# Patient Record
Sex: Female | Born: 1980 | Race: White | Hispanic: No | Marital: Married | Smoking: Never smoker
Health system: Southern US, Community
[De-identification: ages and names within clinical notes are randomized; demographics above are authoritative.]

## PROBLEM LIST (undated history)

## (undated) DIAGNOSIS — R519 Headache, unspecified: Secondary | ICD-10-CM

## (undated) DIAGNOSIS — O1490 Unspecified pre-eclampsia, unspecified trimester: Secondary | ICD-10-CM

## (undated) DIAGNOSIS — I10 Essential (primary) hypertension: Secondary | ICD-10-CM

## (undated) DIAGNOSIS — E559 Vitamin D deficiency, unspecified: Secondary | ICD-10-CM

## (undated) DIAGNOSIS — J45909 Unspecified asthma, uncomplicated: Secondary | ICD-10-CM

## (undated) DIAGNOSIS — E785 Hyperlipidemia, unspecified: Secondary | ICD-10-CM

## (undated) DIAGNOSIS — K219 Gastro-esophageal reflux disease without esophagitis: Secondary | ICD-10-CM

## (undated) DIAGNOSIS — R51 Headache: Secondary | ICD-10-CM

## (undated) DIAGNOSIS — D649 Anemia, unspecified: Secondary | ICD-10-CM

## (undated) DIAGNOSIS — F988 Other specified behavioral and emotional disorders with onset usually occurring in childhood and adolescence: Secondary | ICD-10-CM

## (undated) DIAGNOSIS — Z9289 Personal history of other medical treatment: Secondary | ICD-10-CM

## (undated) DIAGNOSIS — M1990 Unspecified osteoarthritis, unspecified site: Secondary | ICD-10-CM

## (undated) HISTORY — DX: Vitamin D deficiency, unspecified: E55.9

## (undated) HISTORY — DX: Hyperlipidemia, unspecified: E78.5

## (undated) HISTORY — DX: Personal history of other medical treatment: Z92.89

## (undated) HISTORY — DX: Unspecified pre-eclampsia, unspecified trimester: O14.90

---

## 2007-11-06 ENCOUNTER — Observation Stay: Payer: Self-pay | Admitting: Obstetrics and Gynecology

## 2007-11-16 ENCOUNTER — Inpatient Hospital Stay: Payer: Self-pay

## 2007-11-22 ENCOUNTER — Ambulatory Visit: Payer: Self-pay | Admitting: Family Medicine

## 2010-07-27 ENCOUNTER — Ambulatory Visit: Payer: Self-pay | Admitting: Internal Medicine

## 2010-12-09 ENCOUNTER — Ambulatory Visit: Payer: Self-pay

## 2011-04-03 ENCOUNTER — Ambulatory Visit: Payer: Self-pay | Admitting: Internal Medicine

## 2012-04-16 DIAGNOSIS — Z9289 Personal history of other medical treatment: Secondary | ICD-10-CM

## 2012-04-16 HISTORY — DX: Personal history of other medical treatment: Z92.89

## 2013-10-28 DIAGNOSIS — M059 Rheumatoid arthritis with rheumatoid factor, unspecified: Secondary | ICD-10-CM | POA: Insufficient documentation

## 2013-10-28 DIAGNOSIS — M0800 Unspecified juvenile rheumatoid arthritis of unspecified site: Secondary | ICD-10-CM | POA: Insufficient documentation

## 2013-10-28 DIAGNOSIS — G43909 Migraine, unspecified, not intractable, without status migrainosus: Secondary | ICD-10-CM | POA: Insufficient documentation

## 2013-10-28 DIAGNOSIS — Q059 Spina bifida, unspecified: Secondary | ICD-10-CM | POA: Insufficient documentation

## 2015-03-01 ENCOUNTER — Other Ambulatory Visit: Payer: Self-pay

## 2015-03-01 ENCOUNTER — Encounter
Admission: RE | Admit: 2015-03-01 | Discharge: 2015-03-01 | Disposition: A | Discharge status: Home / Self Care | Payer: 59 | Source: Ambulatory Visit | Attending: Obstetrics & Gynecology | Admitting: Obstetrics & Gynecology

## 2015-03-01 DIAGNOSIS — Z01810 Encounter for preprocedural cardiovascular examination: Secondary | ICD-10-CM | POA: Insufficient documentation

## 2015-03-01 DIAGNOSIS — I10 Essential (primary) hypertension: Secondary | ICD-10-CM | POA: Diagnosis not present

## 2015-03-01 DIAGNOSIS — Z01812 Encounter for preprocedural laboratory examination: Secondary | ICD-10-CM | POA: Insufficient documentation

## 2015-03-01 HISTORY — DX: Anemia, unspecified: D64.9

## 2015-03-01 HISTORY — DX: Other specified behavioral and emotional disorders with onset usually occurring in childhood and adolescence: F98.8

## 2015-03-01 HISTORY — DX: Unspecified osteoarthritis, unspecified site: M19.90

## 2015-03-01 HISTORY — DX: Headache, unspecified: R51.9

## 2015-03-01 HISTORY — DX: Headache: R51

## 2015-03-01 HISTORY — DX: Unspecified asthma, uncomplicated: J45.909

## 2015-03-01 HISTORY — DX: Essential (primary) hypertension: I10

## 2015-03-01 LAB — TYPE AND SCREEN
ABO/RH(D): O POS
ANTIBODY SCREEN: POSITIVE
DAT, IGG: NEGATIVE
DAT, complement: NEGATIVE

## 2015-03-01 LAB — CBC
HEMATOCRIT: 38 % (ref 35.0–47.0)
HEMOGLOBIN: 13 g/dL (ref 12.0–16.0)
MCH: 30.2 pg (ref 26.0–34.0)
MCHC: 34.3 g/dL (ref 32.0–36.0)
MCV: 88.1 fL (ref 80.0–100.0)
Platelets: 182 10*3/uL (ref 150–440)
RBC: 4.31 MIL/uL (ref 3.80–5.20)
RDW: 13.1 % (ref 11.5–14.5)
WBC: 7.5 10*3/uL (ref 3.6–11.0)

## 2015-03-01 NOTE — Patient Instructions (Signed)
  Your procedure is scheduled on: March 09, 2015 (Thursday) Report to Day Surgery.[facility], Second Floor) To find out your arrival time please call [phone] between 1PM - 3PM on March 08, 2015 (Wednesday).  Remember: Instructions that are not followed completely may result in serious medical risk, up to and including death, or upon the discretion of your surgeon and anesthesiologist your surgery may need to be rescheduled.    _x___ 1. Do not eat food or drink liquids after midnight. No gum chewing or hard candies.     __x__ 2. No Alcohol for 24 hours before or after surgery.   ____ 3. Bring all medications with you on the day of surgery if instructed.    ___x_ 4. Notify your doctor if there is any change in your medical condition     (cold, fever, infections).     Do not wear jewelry, make-up, hairpins, clips or nail polish.  Do not wear lotions, powders, or perfumes. You may wear deodorant.  Do not shave 48 hours prior to surgery. Men may shave face and neck.  Do not bring valuables to the hospital.    [facility] is not responsible for any belongings or valuables.               Contacts, dentures or bridgework may not be worn into surgery.  Leave your suitcase in the car. After surgery it may be brought to your room.  For patients admitted to the hospital, discharge time is determined by your                treatment team.   Patients discharged the day of surgery will not be allowed to drive home.   Please read over the following fact sheets that you were given:      _x___ Take these medicines the morning of surgery with A SIP OF WATER:    1. Methyldopa  2.   3.   4.  5.  6.  ____ Fleet Enema (as directed)   ____ Use CHG Soap as directed  ____ Use inhalers on the day of surgery  ____ Stop metformin 2 days prior to surgery    ____ Take 1/2 of usual insulin dose the night before surgery and none on the morning of surgery.   ____ Stop  Coumadin/Plavix/aspirin on   _x___ Stop Anti-inflammatories on (Tylenol ok to take for pain if needed)   ____ Stop supplements until after surgery.    ____ Bring C-Pap to the hospital.

## 2015-03-02 LAB — ABO/RH: ABO/RH(D): O POS

## 2015-03-06 NOTE — Pre-Procedure Instructions (Signed)
EKG given to [facility], anesthesia nurse for review.

## 2015-03-09 ENCOUNTER — Ambulatory Visit: Payer: 59 | Admitting: Anesthesiology

## 2015-03-09 ENCOUNTER — Ambulatory Visit
Admission: RE | Admit: 2015-03-09 | Discharge: 2015-03-09 | Disposition: A | Discharge status: Home / Self Care | Payer: 59 | Source: Ambulatory Visit | Attending: Obstetrics & Gynecology | Admitting: Obstetrics & Gynecology

## 2015-03-09 ENCOUNTER — Encounter
Admission: RE | Disposition: A | Discharge status: Home / Self Care | Payer: Self-pay | Source: Ambulatory Visit | Attending: Obstetrics & Gynecology

## 2015-03-09 DIAGNOSIS — Z801 Family history of malignant neoplasm of trachea, bronchus and lung: Secondary | ICD-10-CM | POA: Insufficient documentation

## 2015-03-09 DIAGNOSIS — M069 Rheumatoid arthritis, unspecified: Secondary | ICD-10-CM | POA: Diagnosis not present

## 2015-03-09 DIAGNOSIS — N920 Excessive and frequent menstruation with regular cycle: Secondary | ICD-10-CM | POA: Diagnosis present

## 2015-03-09 DIAGNOSIS — Z880 Allergy status to penicillin: Secondary | ICD-10-CM | POA: Insufficient documentation

## 2015-03-09 DIAGNOSIS — Z808 Family history of malignant neoplasm of other organs or systems: Secondary | ICD-10-CM | POA: Diagnosis not present

## 2015-03-09 DIAGNOSIS — E785 Hyperlipidemia, unspecified: Secondary | ICD-10-CM | POA: Diagnosis not present

## 2015-03-09 DIAGNOSIS — R51 Headache: Secondary | ICD-10-CM | POA: Diagnosis not present

## 2015-03-09 DIAGNOSIS — Z79899 Other long term (current) drug therapy: Secondary | ICD-10-CM | POA: Diagnosis not present

## 2015-03-09 DIAGNOSIS — Z888 Allergy status to other drugs, medicaments and biological substances status: Secondary | ICD-10-CM | POA: Diagnosis not present

## 2015-03-09 DIAGNOSIS — Z836 Family history of other diseases of the respiratory system: Secondary | ICD-10-CM | POA: Diagnosis not present

## 2015-03-09 DIAGNOSIS — F988 Other specified behavioral and emotional disorders with onset usually occurring in childhood and adolescence: Secondary | ICD-10-CM | POA: Diagnosis not present

## 2015-03-09 DIAGNOSIS — I10 Essential (primary) hypertension: Secondary | ICD-10-CM | POA: Diagnosis not present

## 2015-03-09 DIAGNOSIS — Z8249 Family history of ischemic heart disease and other diseases of the circulatory system: Secondary | ICD-10-CM | POA: Diagnosis not present

## 2015-03-09 DIAGNOSIS — E559 Vitamin D deficiency, unspecified: Secondary | ICD-10-CM | POA: Diagnosis not present

## 2015-03-09 DIAGNOSIS — Z807 Family history of other malignant neoplasms of lymphoid, hematopoietic and related tissues: Secondary | ICD-10-CM | POA: Insufficient documentation

## 2015-03-09 DIAGNOSIS — Z882 Allergy status to sulfonamides status: Secondary | ICD-10-CM | POA: Diagnosis not present

## 2015-03-09 HISTORY — PX: DILITATION & CURRETTAGE/HYSTROSCOPY WITH NOVASURE ABLATION: SHX5568

## 2015-03-09 LAB — POCT PREGNANCY, URINE: Preg Test, Ur: NEGATIVE

## 2015-03-09 SURGERY — DILATATION & CURETTAGE/HYSTEROSCOPY WITH NOVASURE ABLATION
Anesthesia: General

## 2015-03-09 MED ORDER — FENTANYL CITRATE (PF) 100 MCG/2ML IJ SOLN
INTRAMUSCULAR | Status: AC
  Filled 2015-03-09: qty 2

## 2015-03-09 MED ORDER — LACTATED RINGERS IV SOLN
INTRAVENOUS | Status: DC
  Administered 2015-03-09 (×2): via INTRAVENOUS

## 2015-03-09 MED ORDER — ONDANSETRON HCL 4 MG/2ML IJ SOLN: 4.0000 mg | Freq: Once | INTRAMUSCULAR | Status: DC | PRN

## 2015-03-09 MED ORDER — FENTANYL CITRATE (PF) 100 MCG/2ML IJ SOLN
25.0000 ug | INTRAMUSCULAR | Status: DC | PRN
  Administered 2015-03-09 (×2): 25 ug via INTRAVENOUS

## 2015-03-09 MED ORDER — MIDAZOLAM HCL 2 MG/2ML IJ SOLN
INTRAMUSCULAR | Status: DC | PRN
  Administered 2015-03-09: 2 mg via INTRAVENOUS

## 2015-03-09 MED ORDER — FENTANYL CITRATE (PF) 100 MCG/2ML IJ SOLN
INTRAMUSCULAR | Status: DC | PRN
  Administered 2015-03-09: 50 ug via INTRAVENOUS

## 2015-03-09 MED ORDER — ONDANSETRON HCL 4 MG/2ML IJ SOLN
INTRAMUSCULAR | Status: DC | PRN
Start: 2015-03-09 — End: 2015-03-09
  Administered 2015-03-09: 4 mg via INTRAVENOUS

## 2015-03-09 MED ORDER — FAMOTIDINE 20 MG PO TABS: 20.0000 mg | ORAL_TABLET | Freq: Once | ORAL | Status: DC

## 2015-03-09 MED ORDER — DEXAMETHASONE SODIUM PHOSPHATE 4 MG/ML IJ SOLN
INTRAMUSCULAR | Status: DC | PRN
  Administered 2015-03-09: 5 mg via INTRAVENOUS

## 2015-03-09 MED ORDER — OXYCODONE-ACETAMINOPHEN 5-325 MG PO TABS: 1.0000 | ORAL_TABLET | ORAL | Status: DC | PRN

## 2015-03-09 MED ORDER — KETOROLAC TROMETHAMINE 30 MG/ML IJ SOLN
INTRAMUSCULAR | Status: DC | PRN
  Administered 2015-03-09: 30 mg via INTRAVENOUS

## 2015-03-09 MED ORDER — SILVER NITRATE-POT NITRATE 75-25 % EX MISC
CUTANEOUS | Status: AC
  Filled 2015-03-09: qty 2

## 2015-03-09 MED ORDER — FAMOTIDINE 20 MG PO TABS
ORAL_TABLET | ORAL | Status: AC
  Administered 2015-03-09: 20 mg
  Filled 2015-03-09: qty 1

## 2015-03-09 SURGICAL SUPPLY — 15 items
CANISTER SUCT 3000ML (MISCELLANEOUS) ×3 IMPLANT
CATH ROBINSON RED A/P 16FR (CATHETERS) ×3 IMPLANT
GLOVE BIO SURGEON STRL SZ8 (GLOVE) ×3 IMPLANT
GOWN STRL REUS W/ TWL LRG LVL3 (GOWN DISPOSABLE) ×1 IMPLANT
GOWN STRL REUS W/ TWL XL LVL3 (GOWN DISPOSABLE) ×1 IMPLANT
GOWN STRL REUS W/TWL LRG LVL3 (GOWN DISPOSABLE) ×2
GOWN STRL REUS W/TWL XL LVL3 (GOWN DISPOSABLE) ×2
IV LACTATED RINGERS 1000ML (IV SOLUTION) ×3 IMPLANT
NS IRRIG 500ML POUR BTL (IV SOLUTION) ×3 IMPLANT
PACK DNC HYST (MISCELLANEOUS) ×3 IMPLANT
PAD OB MATERNITY 4.3X12.25 (PERSONAL CARE ITEMS) ×3 IMPLANT
PAD PREP 24X41 OB/GYN DISP (PERSONAL CARE ITEMS) ×3 IMPLANT
STRAP SAFETY BODY (MISCELLANEOUS) ×3 IMPLANT
TUBING CONNECTING 10 (TUBING) ×2 IMPLANT
TUBING CONNECTING 10' (TUBING) ×1

## 2015-03-09 NOTE — Transfer of Care (Signed)
Immediate Anesthesia Transfer of Care Note  Patient: Angelica Banks  Procedure(s) Performed: Procedure(s): DILATATION & CURETTAGE/HYSTEROSCOPY WITH NOVASURE ABLATION (N/A)  Patient Location: PACU  Anesthesia Type:General  Level of Consciousness: awake, alert  and oriented  Airway & Oxygen Therapy: Patient Spontanous Breathing and Patient connected to face mask oxygen  Post-op Assessment: Report given to RN and Post -op Vital signs reviewed and stable  Post vital signs: Reviewed and stable  Last Vitals:  Filed Vitals:   03/09/15 0908 03/09/15 1013  BP: 144/101 139/93  Pulse: 78   Temp: 37.2 C   Resp: 16     Complications: No apparent anesthesia complications

## 2015-03-09 NOTE — Op Note (Signed)
Operative Note  03/09/2015  PRE-OP DIAGNOSIS: Menorrhagia, Irregular Uterine Bleeding  POST-OP DIAGNOSIS: same   SURGEON: Barnett Applebaum, MD, FACOG  PROCEDURE: Procedure(s): DILATATION & CURETTAGE/HYSTEROSCOPY    ANESTHESIA: Choice   ESTIMATED BLOOD LOSS: Min, <61mL   SPECIMENS:  Endometrial currettings  FLUID DEFICIT: None  COMPLICATIONS: None  DISPOSITION: PACU - hemodynamically stable.  CONDITION: stable  FINDINGS: Exam under anesthesia revealed small, mobile midplane uterus with no masses and bilateral adnexa without masses or fullness. Hysteroscopy revealed a  grossly normal appearing uterine cavity with bilateral tubal ostia and normal appearing endocervical canal. Some proliferative endometrium visualized.  PROCEDURE IN DETAIL: After informed consent was obtained, the patient was taken to the operating room where anesthesia was obtained without difficulty. The patient was positioned in the dorsal lithotomy position in [place]. [name] bladder was catheterized with an in and out foley catheter. The patient was examined under anesthesia, with the above noted findings. The weightedspeculum was placed inside the patient's vagina, and the the anterior lip of the cervix was seen and grasped with the tenaculum. The uterine cavity was sounded to 7cm, and then the cervix was progressively dilated to a 18 French-Pratt dilator. The 30 degree hysteroscope was introduced, with saline fluid used to distend the intrauterine cavity, with the above noted findings.  The hystersocope was removed and the uterine cavity was curetted until a gritty texture was noted, yielding endometrial curettings. Excellent hemostasis was noted, and all instruments were removed, with excellent hemostasis noted throughout. She was then taken out of dorsal lithotomy. Minimal discrepancy in fluid was noted.  The patient tolerated the procedure well. Sponge, lap and needle counts were correct x2. The patient  was taken to recovery room in excellent condition.

## 2015-03-09 NOTE — H&P (Addendum)
History and Physical Interval Note:  03/09/2015 10:19 AM  Angelica Banks  has presented today for surgery, with the diagnosis of IRREGULAR UTERINE BLEEDING. The various methods of treatment have been discussed with the patient and family. After consideration of risks, benefits and other options for treatment, the patient has consented to  Procedure(s): DILATATION & CURETTAGE/HYSTEROSCOPY as a surgical intervention .  [name] history has been reviewed, patient examined, no change in status, stable for surgery.  Pt has the following beta blocker history-  Not taking Beta Blocker.  I have reviewed the patient's chart and labs.  Questions were answered to the patient's satisfaction.    Margalit Leece Eddie Dibbles

## 2015-03-09 NOTE — Anesthesia Procedure Notes (Signed)
Procedure Name: LMA Insertion Date/Time: 03/09/2015 10:50 AM Performed by: [facility] Pre-anesthesia Checklist: Patient identified, Emergency Drugs available, Suction available and Patient being monitored Patient Re-evaluated:Patient Re-evaluated prior to inductionOxygen Delivery Method: Circle system utilized Preoxygenation: Pre-oxygenation with 100% oxygen Intubation Type: IV induction Ventilation: Mask ventilation without difficulty LMA: LMA inserted LMA Size: 3.5 Number of attempts: 1

## 2015-03-09 NOTE — Anesthesia Preprocedure Evaluation (Signed)
Anesthesia Evaluation  Patient identified by MRN, date of birth, ID band Patient awake    Reviewed: Allergy & Precautions, H&P , NPO status , Patient's Chart, lab work & pertinent test results, reviewed documented beta blocker date and time   Airway Mallampati: II  TM Distance: >3 FB Neck ROM: full    Dental  (+) Teeth Intact   Pulmonary neg pulmonary ROS, asthma ,    Pulmonary exam normal        Cardiovascular Exercise Tolerance: Good hypertension, negative cardio ROS Normal cardiovascular exam Rate:Normal     Neuro/Psych  Headaches, PSYCHIATRIC DISORDERS negative neurological ROS  negative psych ROS   GI/Hepatic negative GI ROS, Neg liver ROS,   Endo/Other  negative endocrine ROS  Renal/GU negative Renal ROS  negative genitourinary   Musculoskeletal  (+) Arthritis ,   Abdominal   Peds  Hematology negative hematology ROS (+) anemia ,   Anesthesia Other Findings Good rom cervical  Reproductive/Obstetrics negative OB ROS                             Anesthesia Physical Anesthesia Plan  ASA: II  Anesthesia Plan: General LMA   Post-op Pain Management:    Induction:   Airway Management Planned:   Additional Equipment:   Intra-op Plan:   Post-operative Plan:   Informed Consent: I have reviewed the patients History and Physical, chart, labs and discussed the procedure including the risks, benefits and alternatives for the proposed anesthesia with the patient or authorized representative who has indicated his/her understanding and acceptance.     Plan Discussed with: CRNA  Anesthesia Plan Comments:         Anesthesia Quick Evaluation

## 2015-03-09 NOTE — Discharge Instructions (Signed)
General Gynecological Post-Operative Instructions You may expect to feel dizzy, weak, and drowsy for as long as 24 hours after receiving the medicine that made you sleep (anesthetic).  Do not drive a car, ride a bicycle, participate in physical activities, or take public transportation until you are done taking narcotic pain medicines or as directed by your doctor.  Do not drink alcohol or take tranquilizers.  Do not take medicine that has not been prescribed by your doctor.  Do not sign important papers or make important decisions while on narcotic pain medicines.  Have a responsible person with you.  CARE OF INCISION  Keep incision clean and dry. Take showers instead of baths until your doctor gives you permission to take baths.  Avoid heavy lifting (more than 10 pounds/4.5 kilograms), pushing, or pulling.  Avoid activities that may risk injury to your surgical site.  No sexual intercourse or placement of anything in the vagina for 1-2 weeks or as instructed by your doctor. If you have tubes coming from the wound site, check with your doctor regarding appropriate care of the tubes. Only take prescription or over-the-counter medicines  for pain, discomfort, or fever as directed by your doctor. Do not take aspirin. It can make you bleed. Take medicines (antibiotics) that kill germs if they are prescribed for you.  Call the office or go to the ER if:  You feel sick to your stomach (nauseous) and you start to throw up (vomit).  You have trouble eating or drinking.  You have an oral temperature above 101.  You have constipation that is not helped by adjusting diet or increasing fluid intake. Pain medicines are a common cause of constipation.  You have any other concerns. SEEK IMMEDIATE MEDICAL CARE IF:  You have persistent dizziness.  You have difficulty breathing or a congested sounding (croupy) cough.  You have an oral temperature above 102.5, not controlled by medicine.  There is increasing  pain or tenderness near or in the surgical site.     AMBULATORY SURGERY  DISCHARGE INSTRUCTIONS   1) The drugs that you were given will stay in your system until tomorrow so for the next 24 hours you should not:  A) Drive an automobile B) Make any legal decisions C) Drink any alcoholic beverage   2) You may resume regular meals tomorrow.  Today it is better to start with liquids and gradually work up to solid foods.  You may eat anything you prefer, but it is better to start with liquids, then soup and crackers, and gradually work up to solid foods.   3) Please notify your doctor immediately if you have any unusual bleeding, trouble breathing, redness and pain at the surgery site, drainage, fever, or pain not relieved by medication.    4) Additional Instructions:        Please contact your physician with any problems or Same Day Surgery at [phone], Monday through Friday 6 am to 4 pm, or [place] at [facility] number at [phone].

## 2015-03-10 LAB — SURGICAL PATHOLOGY

## 2015-03-10 NOTE — Anesthesia Postprocedure Evaluation (Signed)
Anesthesia Post Note  Patient: Angelica Banks  Procedure(s) Performed: Procedure(s) (LRB): DILATATION & CURETTAGE/HYSTEROSCOPY WITH NOVASURE ABLATION (N/A)  Patient location during evaluation: PACU Anesthesia Type: General Level of consciousness: awake and alert Pain management: pain level controlled Vital Signs Assessment: post-procedure vital signs reviewed and stable Respiratory status: spontaneous breathing, nonlabored ventilation, respiratory function stable and patient connected to nasal cannula oxygen Cardiovascular status: blood pressure returned to baseline and stable Postop Assessment: no signs of nausea or vomiting Anesthetic complications: no    Last Vitals:  Filed Vitals:   03/09/15 1218 03/09/15 1241  BP: 127/90 143/92  Pulse: 52 60  Temp: 35.9 C   Resp: 16     Last Pain:  Filed Vitals:   03/10/15 0835  PainSc: 0-No pain                 Molli Barrows

## 2015-05-30 LAB — HM PAP SMEAR: HM Pap smear: NEGATIVE

## 2016-03-21 ENCOUNTER — Inpatient Hospital Stay: Admission: RE | Admit: 2016-03-21 | Payer: 59 | Source: Ambulatory Visit

## 2016-03-22 ENCOUNTER — Encounter
Admission: RE | Admit: 2016-03-22 | Discharge: 2016-03-22 | Disposition: A | Discharge status: Home / Self Care | Payer: 59 | Source: Ambulatory Visit | Attending: Obstetrics & Gynecology | Admitting: Obstetrics & Gynecology

## 2016-03-22 HISTORY — DX: Gastro-esophageal reflux disease without esophagitis: K21.9

## 2016-03-22 NOTE — Patient Instructions (Signed)
  Your procedure is scheduled on: 03-27-16 [facility]) Report to Same Day Surgery 2nd floor medical mall [facility] Entrance-take elevator on left to 2nd floor.  Check in with surgery information desk.) To find out your arrival time please call [phone] between 1PM - 3PM on 03-26-16 (TUESDAY)  Remember: Instructions that are not followed completely may result in serious medical risk, up to and including death, or upon the discretion of your surgeon and anesthesiologist your surgery may need to be rescheduled.    _x___ 1. Do not eat food or drink liquids after midnight. No gum chewing or hard candies.     __x__ 2. No Alcohol for 24 hours before or after surgery.   __x__3. No Smoking for 24 prior to surgery.   ____  4. Bring all medications with you on the day of surgery if instructed.    __x__ 5. Notify your doctor if there is any change in your medical condition     (cold, fever, infections).     Do not wear jewelry, make-up, hairpins, clips or nail polish.  Do not wear lotions, powders, or perfumes. You may wear deodorant.  Do not shave 48 hours prior to surgery. Men may shave face and neck.  Do not bring valuables to the hospital.    [facility] is not responsible for any belongings or valuables.               Contacts, dentures or bridgework may not be worn into surgery.  Leave your suitcase in the car. After surgery it may be brought to your room.  For patients admitted to the hospital, discharge time is determined by your treatment team.   Patients discharged the day of surgery will not be allowed to drive home.  You will need someone to drive you home and stay with you the night of your procedure.    Please read over the following fact sheets that you were given:   [facility] Preparing for Surgery and or MRSA Information   _x___ Take these medicines the morning of surgery with A SIP OF WATER:    1. ALDOMET (METHYLDOPA)  2.  3.  4.  5.  6.  ____Fleets enema or  Magnesium Citrate as directed.   ____ Use CHG Soap or sage wipes as directed on instruction sheet   ____ Use inhalers on the day of surgery and bring to hospital day of surgery  ____ Stop metformin 2 days prior to surgery    ____ Take 1/2 of usual insulin dose the night before surgery and none on the morning of surgery.   ____ Stop Aspirin, Coumadin, Pllavix ,Eliquis, Effient, or Pradaxa  x__ Stop Anti-inflammatories such as Advil, Aleve, Ibuprofen, Motrin, Naproxen,          Naprosyn, Goodies powders or aspirin products NOW- Ok to take Tylenol.   ____ Stop supplements until after surgery.    ____ Bring C-Pap to the hospital.

## 2016-03-25 ENCOUNTER — Encounter
Admission: RE | Admit: 2016-03-25 | Discharge: 2016-03-25 | Disposition: A | Discharge status: Home / Self Care | Payer: 59 | Source: Ambulatory Visit | Attending: Obstetrics & Gynecology | Admitting: Obstetrics & Gynecology

## 2016-03-25 DIAGNOSIS — M1990 Unspecified osteoarthritis, unspecified site: Secondary | ICD-10-CM | POA: Diagnosis not present

## 2016-03-25 DIAGNOSIS — N939 Abnormal uterine and vaginal bleeding, unspecified: Secondary | ICD-10-CM | POA: Diagnosis not present

## 2016-03-25 DIAGNOSIS — I10 Essential (primary) hypertension: Secondary | ICD-10-CM | POA: Diagnosis not present

## 2016-03-25 DIAGNOSIS — R51 Headache: Secondary | ICD-10-CM | POA: Diagnosis not present

## 2016-03-25 DIAGNOSIS — D649 Anemia, unspecified: Secondary | ICD-10-CM | POA: Diagnosis not present

## 2016-03-25 DIAGNOSIS — D250 Submucous leiomyoma of uterus: Secondary | ICD-10-CM | POA: Diagnosis not present

## 2016-03-25 DIAGNOSIS — K219 Gastro-esophageal reflux disease without esophagitis: Secondary | ICD-10-CM | POA: Diagnosis not present

## 2016-03-25 DIAGNOSIS — J45909 Unspecified asthma, uncomplicated: Secondary | ICD-10-CM | POA: Diagnosis not present

## 2016-03-25 DIAGNOSIS — N840 Polyp of corpus uteri: Secondary | ICD-10-CM | POA: Diagnosis not present

## 2016-03-25 LAB — CBC
HEMATOCRIT: 35.2 % (ref 35.0–47.0)
HEMOGLOBIN: 11.8 g/dL — AB (ref 12.0–16.0)
MCH: 27.4 pg (ref 26.0–34.0)
MCHC: 33.6 g/dL (ref 32.0–36.0)
MCV: 81.8 fL (ref 80.0–100.0)
Platelets: 195 10*3/uL (ref 150–440)
RBC: 4.3 MIL/uL (ref 3.80–5.20)
RDW: 14.5 % (ref 11.5–14.5)
WBC: 5.8 10*3/uL (ref 3.6–11.0)

## 2016-03-25 LAB — TYPE AND SCREEN
ABO/RH(D): O POS
ANTIBODY SCREEN: NEGATIVE

## 2016-03-27 ENCOUNTER — Encounter: Payer: Self-pay | Admitting: *Deleted

## 2016-03-27 ENCOUNTER — Encounter
Admission: RE | Disposition: A | Discharge status: Home / Self Care | Payer: Self-pay | Source: Ambulatory Visit | Attending: Obstetrics & Gynecology

## 2016-03-27 ENCOUNTER — Ambulatory Visit: Payer: 59 | Admitting: Anesthesiology

## 2016-03-27 ENCOUNTER — Ambulatory Visit
Admission: RE | Admit: 2016-03-27 | Discharge: 2016-03-27 | Disposition: A | Discharge status: Home / Self Care | Payer: 59 | Source: Ambulatory Visit | Attending: Obstetrics & Gynecology | Admitting: Obstetrics & Gynecology

## 2016-03-27 DIAGNOSIS — N840 Polyp of corpus uteri: Secondary | ICD-10-CM | POA: Insufficient documentation

## 2016-03-27 DIAGNOSIS — I10 Essential (primary) hypertension: Secondary | ICD-10-CM | POA: Insufficient documentation

## 2016-03-27 DIAGNOSIS — R51 Headache: Secondary | ICD-10-CM | POA: Insufficient documentation

## 2016-03-27 DIAGNOSIS — D250 Submucous leiomyoma of uterus: Secondary | ICD-10-CM | POA: Diagnosis not present

## 2016-03-27 DIAGNOSIS — J45909 Unspecified asthma, uncomplicated: Secondary | ICD-10-CM | POA: Insufficient documentation

## 2016-03-27 DIAGNOSIS — N939 Abnormal uterine and vaginal bleeding, unspecified: Secondary | ICD-10-CM | POA: Insufficient documentation

## 2016-03-27 DIAGNOSIS — K219 Gastro-esophageal reflux disease without esophagitis: Secondary | ICD-10-CM | POA: Insufficient documentation

## 2016-03-27 DIAGNOSIS — D649 Anemia, unspecified: Secondary | ICD-10-CM | POA: Insufficient documentation

## 2016-03-27 DIAGNOSIS — M1990 Unspecified osteoarthritis, unspecified site: Secondary | ICD-10-CM | POA: Insufficient documentation

## 2016-03-27 HISTORY — PX: DILATATION & CURETTAGE/HYSTEROSCOPY WITH MYOSURE: SHX6511

## 2016-03-27 LAB — POCT PREGNANCY, URINE: PREG TEST UR: NEGATIVE

## 2016-03-27 SURGERY — DILATATION & CURETTAGE/HYSTEROSCOPY WITH MYOSURE
Anesthesia: General | Wound class: Clean Contaminated

## 2016-03-27 MED ORDER — PROMETHAZINE HCL 25 MG/ML IJ SOLN: 6.2500 mg | INTRAMUSCULAR | Status: DC | PRN

## 2016-03-27 MED ORDER — LACTATED RINGERS IV SOLN
INTRAVENOUS | Status: DC
  Administered 2016-03-27: 09:00:00 via INTRAVENOUS

## 2016-03-27 MED ORDER — FAMOTIDINE 20 MG PO TABS
ORAL_TABLET | ORAL | Status: AC
  Administered 2016-03-27: 20 mg via ORAL
  Filled 2016-03-27: qty 1

## 2016-03-27 MED ORDER — FAMOTIDINE 20 MG PO TABS
20.0000 mg | ORAL_TABLET | Freq: Once | ORAL | Status: AC
  Administered 2016-03-27: 20 mg via ORAL

## 2016-03-27 MED ORDER — ONDANSETRON HCL 4 MG/2ML IJ SOLN
INTRAMUSCULAR | Status: AC
  Filled 2016-03-27: qty 2

## 2016-03-27 MED ORDER — SCOPOLAMINE 1 MG/3DAYS TD PT72
MEDICATED_PATCH | TRANSDERMAL | Status: AC
  Administered 2016-03-27: 1.5 mg via TRANSDERMAL
  Filled 2016-03-27: qty 1

## 2016-03-27 MED ORDER — KETAMINE HCL 50 MG/ML IJ SOLN
INTRAMUSCULAR | Status: DC | PRN
  Administered 2016-03-27: 40 mg via INTRAVENOUS

## 2016-03-27 MED ORDER — PROPOFOL 10 MG/ML IV BOLUS
INTRAVENOUS | Status: AC
  Filled 2016-03-27: qty 20

## 2016-03-27 MED ORDER — MIDAZOLAM HCL 2 MG/2ML IJ SOLN
INTRAMUSCULAR | Status: AC
  Filled 2016-03-27: qty 2

## 2016-03-27 MED ORDER — MIDAZOLAM HCL 2 MG/2ML IJ SOLN
INTRAMUSCULAR | Status: DC | PRN
  Administered 2016-03-27: 2 mg via INTRAVENOUS

## 2016-03-27 MED ORDER — OXYCODONE-ACETAMINOPHEN 5-325 MG PO TABS
ORAL_TABLET | ORAL | Status: AC
  Filled 2016-03-27: qty 1

## 2016-03-27 MED ORDER — OXYCODONE-ACETAMINOPHEN 5-325 MG PO TABS
1.0000 | ORAL_TABLET | ORAL | Status: DC | PRN
  Administered 2016-03-27: 1 via ORAL

## 2016-03-27 MED ORDER — LIDOCAINE 2% (20 MG/ML) 5 ML SYRINGE
INTRAMUSCULAR | Status: AC
  Filled 2016-03-27: qty 5

## 2016-03-27 MED ORDER — GLYCOPYRROLATE 0.2 MG/ML IJ SOLN
INTRAMUSCULAR | Status: DC | PRN
  Administered 2016-03-27: 0.2 mg via INTRAVENOUS

## 2016-03-27 MED ORDER — PROPOFOL 10 MG/ML IV BOLUS
INTRAVENOUS | Status: DC | PRN
  Administered 2016-03-27: 150 mg via INTRAVENOUS

## 2016-03-27 MED ORDER — LIDOCAINE HCL (CARDIAC) 20 MG/ML IV SOLN
INTRAVENOUS | Status: DC | PRN
  Administered 2016-03-27: 100 mg via INTRAVENOUS

## 2016-03-27 MED ORDER — FENTANYL CITRATE (PF) 100 MCG/2ML IJ SOLN
INTRAMUSCULAR | Status: DC | PRN
  Administered 2016-03-27: 100 ug via INTRAVENOUS

## 2016-03-27 MED ORDER — KETOROLAC TROMETHAMINE 30 MG/ML IJ SOLN
INTRAMUSCULAR | Status: AC
  Filled 2016-03-27: qty 1

## 2016-03-27 MED ORDER — GLYCOPYRROLATE 0.2 MG/ML IJ SOLN
INTRAMUSCULAR | Status: AC
  Filled 2016-03-27: qty 1

## 2016-03-27 MED ORDER — DEXAMETHASONE SODIUM PHOSPHATE 4 MG/ML IJ SOLN
INTRAMUSCULAR | Status: DC | PRN
  Administered 2016-03-27: 5 mg via INTRAVENOUS

## 2016-03-27 MED ORDER — OXYCODONE-ACETAMINOPHEN 5-325 MG PO TABS: 1.0000 | ORAL_TABLET | ORAL | 0 refills | Status: DC | PRN

## 2016-03-27 MED ORDER — SCOPOLAMINE 1 MG/3DAYS TD PT72
1.0000 | MEDICATED_PATCH | Freq: Once | TRANSDERMAL | Status: DC
  Administered 2016-03-27: 1.5 mg via TRANSDERMAL

## 2016-03-27 MED ORDER — KETAMINE HCL 10 MG/ML IJ SOLN
INTRAMUSCULAR | Status: AC
  Filled 2016-03-27: qty 1

## 2016-03-27 MED ORDER — FENTANYL CITRATE (PF) 100 MCG/2ML IJ SOLN
INTRAMUSCULAR | Status: AC
  Filled 2016-03-27: qty 2

## 2016-03-27 MED ORDER — KETOROLAC TROMETHAMINE 30 MG/ML IJ SOLN
INTRAMUSCULAR | Status: DC | PRN
Start: 2016-03-27 — End: 2016-03-27
  Administered 2016-03-27: 30 mg via INTRAVENOUS

## 2016-03-27 MED ORDER — ONDANSETRON HCL 4 MG/2ML IJ SOLN
INTRAMUSCULAR | Status: DC | PRN
  Administered 2016-03-27: 4 mg via INTRAVENOUS

## 2016-03-27 MED ORDER — FENTANYL CITRATE (PF) 100 MCG/2ML IJ SOLN: 25.0000 ug | INTRAMUSCULAR | Status: DC | PRN

## 2016-03-27 SURGICAL SUPPLY — 48 items
ABLATOR ENDOMETRIAL MYOSURE (ABLATOR) IMPLANT
BAG COUNTER SPONGE EZ (MISCELLANEOUS) ×2 IMPLANT
CANISTER SUC SOCK COL 7IN (MISCELLANEOUS) ×3 IMPLANT
CANISTER SUCT 1200ML W/VALVE (MISCELLANEOUS) ×3 IMPLANT
CATH ROBINSON RED A/P 16FR (CATHETERS) ×3 IMPLANT
CATH TRAY 16F METER LATEX (MISCELLANEOUS) IMPLANT
CHLORAPREP W/TINT 26ML (MISCELLANEOUS) ×3 IMPLANT
CLOSURE WOUND 1/2 X4 (GAUZE/BANDAGES/DRESSINGS)
COUNTER NEEDLE 20/40 LG (NEEDLE) IMPLANT
COUNTER SPONGE BAG EZ (MISCELLANEOUS) ×1
DEVICE MYOSURE LITE (MISCELLANEOUS) ×3 IMPLANT
DRAPE LAPAROTOMY 100X77 ABD (DRAPES) IMPLANT
DRAPE LAPAROTOMY TRNSV 106X77 (MISCELLANEOUS) IMPLANT
DRSG TELFA 3X8 NADH (GAUZE/BANDAGES/DRESSINGS) IMPLANT
ELECT BLADE 6.5 EXT (BLADE) IMPLANT
ELECT CAUTERY BLADE 6.4 (BLADE) ×3 IMPLANT
ELECT REM PT RETURN 9FT ADLT (ELECTROSURGICAL)
ELECTRODE REM PT RTRN 9FT ADLT (ELECTROSURGICAL) IMPLANT
GAUZE SPONGE 4X4 12PLY STRL (GAUZE/BANDAGES/DRESSINGS) IMPLANT
GLOVE BIO SURGEON STRL SZ8 (GLOVE) ×3 IMPLANT
GOWN STRL REUS W/ TWL LRG LVL3 (GOWN DISPOSABLE) ×1 IMPLANT
GOWN STRL REUS W/ TWL XL LVL3 (GOWN DISPOSABLE) ×1 IMPLANT
GOWN STRL REUS W/TWL LRG LVL3 (GOWN DISPOSABLE) ×2
GOWN STRL REUS W/TWL XL LVL3 (GOWN DISPOSABLE) ×2
NS IRRIG 1000ML POUR BTL (IV SOLUTION) IMPLANT
PACK BASIN MAJOR ARMC (MISCELLANEOUS) ×3 IMPLANT
PACK DNC HYST (MISCELLANEOUS) ×3 IMPLANT
PAD OB MATERNITY 4.3X12.25 (PERSONAL CARE ITEMS) ×3 IMPLANT
PAD PREP 24X41 OB/GYN DISP (PERSONAL CARE ITEMS) ×3 IMPLANT
SOL .9 NS 3000ML IRR  AL (IV SOLUTION) ×2
SOL .9 NS 3000ML IRR UROMATIC (IV SOLUTION) ×1 IMPLANT
SPONGE LAP 18X18 5 PK (GAUZE/BANDAGES/DRESSINGS) IMPLANT
STRAP SAFETY BODY (MISCELLANEOUS) ×3 IMPLANT
STRIP CLOSURE SKIN 1/2X4 (GAUZE/BANDAGES/DRESSINGS) IMPLANT
SUT ETHIBOND 0 (SUTURE) IMPLANT
SUT MAXON ABS #0 GS21 30IN (SUTURE) IMPLANT
SUT VIC AB 0 CT1 27 (SUTURE)
SUT VIC AB 0 CT1 27XCR 8 STRN (SUTURE) IMPLANT
SUT VIC AB 0 CT1 36 (SUTURE) IMPLANT
SUT VIC AB 4-0 PS2 18 (SUTURE) IMPLANT
SUT VICRYL PLUS ABS 0 54 (SUTURE) IMPLANT
SYR CONTROL 10ML (SYRINGE) ×3 IMPLANT
TOWEL BLUE STERILE X RAY DET (MISCELLANEOUS) IMPLANT
TOWEL OR 17X26 4PK STRL BLUE (TOWEL DISPOSABLE) ×3 IMPLANT
TRAY PREP VAG/GEN (MISCELLANEOUS) IMPLANT
TUBING CONNECTING 10 (TUBING) ×2 IMPLANT
TUBING CONNECTING 10' (TUBING) ×1
TUBING HYSTEROSCOPY DOLPHIN (MISCELLANEOUS) IMPLANT

## 2016-03-27 NOTE — Discharge Instructions (Signed)
General Gynecological Post-Operative Instructions °You may expect to feel dizzy, weak, and drowsy for as long as 24 hours after receiving the medicine that made you sleep (anesthetic).  °Do not drive a car, ride a bicycle, participate in physical activities, or take public transportation until you are done taking narcotic pain medicines or as directed by your doctor.  °Do not drink alcohol or take tranquilizers.  °Do not take medicine that has not been prescribed by your doctor.  °Do not sign important papers or make important decisions while on narcotic pain medicines.  °Have a responsible person with you.  °CARE OF INCISION  °Keep incision clean and dry. °Take showers instead of baths until your doctor gives you permission to take baths.  °Avoid heavy lifting (more than 10 pounds/4.5 kilograms), pushing, or pulling.  °Avoid activities that may risk injury to your surgical site.  °No sexual intercourse or placement of anything in the vagina for 2 weeks or as instructed by your doctor. °If you have tubes coming from the wound site, check with your doctor regarding appropriate care of the tubes. °Only take prescription or over-the-counter medicines  for pain, discomfort, or fever as directed by your doctor. Do not take aspirin. It can make you bleed. Take medicines (antibiotics) that kill germs if they are prescribed for you.  °Call the office or go to the ER if:  °You feel sick to your stomach (nauseous) and you start to throw up (vomit).  °You have trouble eating or drinking.  °You have an oral temperature above 101.  °You have constipation that is not helped by adjusting diet or increasing fluid intake. Pain medicines are a common cause of constipation.  °You have any other concerns. °SEEK IMMEDIATE MEDICAL CARE IF:  °You have persistent dizziness.  °You have difficulty breathing or a congested sounding (croupy) cough.  °You have an oral temperature above 102.5, not controlled by medicine.  °There is increasing  pain or tenderness near or in the surgical site.  ° °AMBULATORY SURGERY  °DISCHARGE INSTRUCTIONS ° ° °1) The drugs that you were given will stay in your system until tomorrow so for the next 24 hours you should not: ° °A) Drive an automobile °B) Make any legal decisions °C) Drink any alcoholic beverage ° ° °2) You may resume regular meals tomorrow.  Today it is better to start with liquids and gradually work up to solid foods. ° °You may eat anything you prefer, but it is better to start with liquids, then soup and crackers, and gradually work up to solid foods. ° ° °3) Please notify your doctor immediately if you have any unusual bleeding, trouble breathing, redness and pain at the surgery site, drainage, fever, or pain not relieved by medication. ° ° ° °4) Additional Instructions: ° ° ° ° ° ° ° °Please contact your physician with any problems or Same Day Surgery at [phone], Monday through Friday 6 am to 4 pm, or [place] at [place] Main number at [phone]. ° ° °

## 2016-03-27 NOTE — H&P (Signed)
History and Physical Interval Note:  03/27/2016 8:36 AM  Angelica Banks  has presented today for surgery, with the diagnosis of FIBROID UTERUS,MENOMETRORRHAGIA  The various methods of treatment have been discussed with the patient and family. After consideration of risks, benefits and other options for treatment, the patient has consented to  Procedure(s): [place] (N/A) MYOMECTOMY (N/A) as a surgical intervention .  [name] history has been reviewed, patient examined, no change in status, stable for surgery.  Pt has the following beta blocker history-  Not taking Beta Blocker.  I have reviewed the patient's chart and labs.  Questions were answered to the patient's satisfaction.    Hoyt Koch

## 2016-03-27 NOTE — Op Note (Signed)
Operative Note  03/27/2016  PRE-OP DIAGNOSIS: Irregular vaginal Bleeding, Pelvic Pain, Submucosal Leiomyoma  POST-OP DIAGNOSIS: same   SURGEON: Barnett Applebaum, MD, FACOG  PROCEDURE: Procedure(s): DILATATION & CURETTAGE/HYSTEROSCOPY WITH MYOSURE  ANESTHESIA: Choice   ESTIMATED BLOOD LOSS: Min   SPECIMENS:  Endometrial curretings  FLUID DEFICIT: Min  COMPLICATIONS: None  DISPOSITION: PACU - hemodynamically stable.  CONDITION: stable  FINDINGS: Exam under anesthesia revealed small, mobile  uterus with no masses and bilateral adnexa without masses or fullness. Hysteroscopy revealed a  grossly normal appearing uterine cavity with bilateral tubal ostia and normal appearing endocervical canal.  One area of thickening but no obvious polyp or fibroid.  PROCEDURE IN DETAIL: After informed consent was obtained, the patient was taken to the operating room where anesthesia was obtained without difficulty. The patient was positioned in the dorsal lithotomy position in [place]. [name] bladder was catheterized with an in and out foley catheter. The patient was examined under anesthesia, with the above noted findings. The weightedspeculum was placed inside the patient's vagina, and the the anterior lip of the cervix was seen and grasped with the tenaculum.  An Endocervical specimen was obtained with a kevorkian curette. The uterine cavity was sounded to 8cm, and then the cervix was progressively dilated to a 15French-Pratt dilator. The 30 degree hysteroscope was introduced, with saline fluid used to distend the intrauterine cavity, with the above noted findings.  The myosure device was used to excise thickened area of endometrium, yielding endometrial curettings. Excellent hemostasis was noted, and all instruments were removed, with excellent hemostasis noted throughout. She was then taken out of dorsal lithotomy.  Minimal discrepancy in fluid was noted.  The patient tolerated the procedure  well. Sponge, lap and needle counts were correct x2. The patient was taken to recovery room in excellent condition.

## 2016-03-27 NOTE — Transfer of Care (Signed)
Immediate Anesthesia Transfer of Care Note  Patient: Angelica Banks  Procedure(s) Performed: Procedure(s): DILATATION & CURETTAGE/HYSTEROSCOPY WITH MYOSURE, MYOMECTOMY (N/A)  Patient Location: PACU  Anesthesia Type:General  Level of Consciousness: sedated  Airway & Oxygen Therapy: Patient Spontanous Breathing and Patient connected to nasal cannula oxygen  Post-op Assessment: Report given to RN and Post -op Vital signs reviewed and stable  Post vital signs: Reviewed and stable  Last Vitals:  Vitals:   03/27/16 0829 03/27/16 0839  BP:  (!) 132/91  Pulse:  80  Resp: 16   Temp:  36.1 C    Last Pain:  Vitals:   03/27/16 0829  TempSrc: Tympanic         Complications: No apparent anesthesia complications

## 2016-03-27 NOTE — Anesthesia Preprocedure Evaluation (Signed)
Anesthesia Evaluation  Patient identified by MRN, date of birth, ID band Patient awake    Reviewed: Allergy & Precautions, H&P , NPO status , Patient's Chart, lab work & pertinent test results, reviewed documented beta blocker date and time   History of Anesthesia Complications (+) PONV and history of anesthetic complications  Airway Mallampati: III  TM Distance: >3 FB Neck ROM: full    Dental  (+) Missing, Dental Advidsory Given, Teeth Intact   Pulmonary neg shortness of breath, asthma , neg sleep apnea, neg COPD, neg recent URI,    Pulmonary exam normal breath sounds clear to auscultation       Cardiovascular Exercise Tolerance: Good hypertension, (-) angina(-) CAD, (-) Past MI, (-) Cardiac Stents and (-) CABG Normal cardiovascular exam(-) dysrhythmias (-) Valvular Problems/Murmurs Rhythm:regular Rate:Normal     Neuro/Psych negative neurological ROS  negative psych ROS   GI/Hepatic Neg liver ROS, GERD  ,  Endo/Other  negative endocrine ROS  Renal/GU negative Renal ROS  negative genitourinary   Musculoskeletal   Abdominal   Peds  Hematology  (+) Blood dyscrasia, anemia ,   Anesthesia Other Findings Past Medical History: No date: ADD (attention deficit disorder) No date: Anemia     Comment: iron deficiency anemia No date: Arthritis     Comment: Rheumatoid arthritis No date: Asthma     Comment: well-controlled-no inhalers No date: GERD (gastroesophageal reflux disease)     Comment: occ- rolaids No date: Headache No date: Hypertension   Reproductive/Obstetrics negative OB ROS                             Anesthesia Physical Anesthesia Plan  ASA: II  Anesthesia Plan: General   Post-op Pain Management:    Induction:   Airway Management Planned:   Additional Equipment:   Intra-op Plan:   Post-operative Plan:   Informed Consent: I have reviewed the patients History and  Physical, chart, labs and discussed the procedure including the risks, benefits and alternatives for the proposed anesthesia with the patient or authorized representative who has indicated his/her understanding and acceptance.   Dental Advisory Given  Plan Discussed with: Anesthesiologist, CRNA and Surgeon  Anesthesia Plan Comments:         Anesthesia Quick Evaluation

## 2016-03-27 NOTE — Anesthesia Procedure Notes (Signed)
Procedure Name: LMA Insertion Date/Time: 03/27/2016 10:08 AM Performed by: Rosaria Ferries, Moyses Pavey Pre-anesthesia Checklist: Patient identified, Emergency Drugs available, Suction available and Patient being monitored Patient Re-evaluated:Patient Re-evaluated prior to inductionOxygen Delivery Method: Circle system utilized Preoxygenation: Pre-oxygenation with 100% oxygen Intubation Type: IV induction LMA Size: 4.0 Number of attempts: 1 Placement Confirmation: breath sounds checked- equal and bilateral Tube secured with: Tape Dental Injury: Teeth and Oropharynx as per pre-operative assessment

## 2016-03-28 LAB — SURGICAL PATHOLOGY

## 2016-03-29 ENCOUNTER — Encounter: Payer: Self-pay | Admitting: Obstetrics & Gynecology

## 2016-03-29 NOTE — Anesthesia Postprocedure Evaluation (Signed)
Anesthesia Post Note  Patient: Angelica Banks  Procedure(s) Performed: Procedure(s) (LRB): DILATATION & CURETTAGE/HYSTEROSCOPY WITH MYOSURE, MYOMECTOMY (N/A)  Patient location during evaluation: PACU Anesthesia Type: General Level of consciousness: awake and alert Pain management: pain level controlled Vital Signs Assessment: post-procedure vital signs reviewed and stable Respiratory status: spontaneous breathing, nonlabored ventilation, respiratory function stable and patient connected to nasal cannula oxygen Cardiovascular status: blood pressure returned to baseline and stable Postop Assessment: no signs of nausea or vomiting Anesthetic complications: no     Last Vitals:  Vitals:   03/27/16 1135 03/27/16 1145  BP: (!) 124/91 122/84  Pulse: 64 60  Resp: 13 14  Temp: 36.5 C 36.4 C    Last Pain:  Vitals:   03/27/16 1145  TempSrc:   PainSc: 3                  Martha Clan

## 2016-06-12 ENCOUNTER — Other Ambulatory Visit: Payer: Self-pay | Admitting: Obstetrics and Gynecology

## 2016-06-12 ENCOUNTER — Telehealth: Payer: Self-pay | Admitting: Obstetrics and Gynecology

## 2016-06-12 MED ORDER — METHYLDOPA 500 MG PO TABS: 500.0000 mg | ORAL_TABLET | Freq: Two times a day (BID) | ORAL | 1 refills | Status: DC

## 2016-06-12 NOTE — Telephone Encounter (Signed)
Pt is calling today needing an Refill on her prescription Methyldopa. Pt is schedule for her Annual 0/47/53 with Elmo Putt Copland. Cb# [phone]

## 2016-06-13 NOTE — Telephone Encounter (Signed)
Please advise 

## 2016-06-13 NOTE — Telephone Encounter (Signed)
Pt states she will pick medication up today

## 2016-06-13 NOTE — Telephone Encounter (Signed)
I sent in RF yesterday. Is she calling back or you just following up? Thx.

## 2016-07-17 DIAGNOSIS — E785 Hyperlipidemia, unspecified: Secondary | ICD-10-CM

## 2016-07-17 DIAGNOSIS — E559 Vitamin D deficiency, unspecified: Secondary | ICD-10-CM | POA: Insufficient documentation

## 2016-07-18 ENCOUNTER — Ambulatory Visit (INDEPENDENT_AMBULATORY_CARE_PROVIDER_SITE_OTHER): Payer: 59 | Admitting: Obstetrics and Gynecology

## 2016-07-18 ENCOUNTER — Encounter: Payer: Self-pay | Admitting: Obstetrics and Gynecology

## 2016-07-18 VITALS — BP 130/80 | HR 62 | Ht 64.0 in | Wt 178.0 lb

## 2016-07-18 DIAGNOSIS — N921 Excessive and frequent menstruation with irregular cycle: Secondary | ICD-10-CM | POA: Diagnosis not present

## 2016-07-18 DIAGNOSIS — Z01419 Encounter for gynecological examination (general) (routine) without abnormal findings: Secondary | ICD-10-CM | POA: Diagnosis not present

## 2016-07-18 DIAGNOSIS — F988 Other specified behavioral and emotional disorders with onset usually occurring in childhood and adolescence: Secondary | ICD-10-CM | POA: Insufficient documentation

## 2016-07-18 DIAGNOSIS — I10 Essential (primary) hypertension: Secondary | ICD-10-CM | POA: Diagnosis not present

## 2016-07-18 MED ORDER — NAPROXEN 500 MG PO TABS: 500.0000 mg | ORAL_TABLET | Freq: Two times a day (BID) | ORAL | 2 refills | Status: DC

## 2016-07-18 MED ORDER — METHYLDOPA 500 MG PO TABS: 500.0000 mg | ORAL_TABLET | Freq: Two times a day (BID) | ORAL | 12 refills | Status: DC

## 2016-07-18 NOTE — Progress Notes (Signed)
Chief Complaint  Patient presents with  . Gynecologic Exam     HPI:      [name] is a 36 y.o. G1P1001 who LMP was Patient's last menstrual period was 06/22/2016., presents today for her annual examination.  Her menses are regular every 28-30 days, lasting 3 days, but has 7 days of spotting before.  Dysmenorrhea severe, occurring throughout menses. Takes ibup 800 mg Q4 hrs/heating pad. She does not have intermenstrual bleeding. DUB sx from last yr resolved after D&C and polypectomy with Dr. Kenton Kingfisher. No longer trying to conceive so prog only BC an option for cycle control, but pt not interested. Didn't have good cycle control with POPs in the past.   Sex activity: single partner, contraception - none.  Infertility for a few yrs. Last Pap: May 30, 2015  Results were: no abnormalities /neg HPV DNA    There is a FH of breast cancer in her pat aunt, genetic testing not indicated. There is no FH of ovarian cancer. The patient does do self-breast exams.  Tobacco use: The patient denies current or previous tobacco use. Alcohol use: social drinker Exercise: moderately active  She does get adequate calcium and Vitamin D in her diet.  We are managing her HTN. She was on lisinopril in the past but changed to methyldopa when trying to conceive. BPs are well controlled, no side effects. Pt prefers to stay on this medication.    Past Medical History:  Diagnosis Date  . ADD (attention deficit disorder)   . Anemia    iron deficiency anemia  . Arthritis    Rheumatoid arthritis  . Asthma    well-controlled-no inhalers  . GERD (gastroesophageal reflux disease)    occ- rolaids  . Headache   . History of Papanicolaou smear of cervix 04/16/2012   -/-  . Hyperlipemia   . Hypertension   . Pre-eclampsia   . Preeclampsia   . Vitamin D deficiency     Past Surgical History:  Procedure Laterality Date  . DILATATION & CURETTAGE/HYSTEROSCOPY WITH MYOSURE N/A 03/27/2016   Procedure:  [place] WITH MYOSURE, MYOMECTOMY;  Surgeon: Gae Dry, MD;  Location: [facility] ORS;  Service: Gynecology;  Laterality: N/A;  . DILITATION & CURRETTAGE/HYSTROSCOPY WITH NOVASURE ABLATION N/A 03/09/2015   Procedure: DILATATION & CURETTAGE/HYSTEROSCOPY WITH NOVASURE ABLATION;  Surgeon: Gae Dry, MD;  Location: [facility] ORS;  Service: Gynecology;  Laterality: N/A;    Family History  Problem Relation Age of Onset  . Hypertension Mother   . Hypertension Father   . Hyperlipidemia Father   . Lymphoma Father 70    non hodgkins   . Brain cancer Paternal Uncle   . Lung cancer Maternal Grandmother   . Breast cancer Paternal Aunt 43    Social History   Social History  . Marital status: Married    Spouse name: N/A  . Number of children: 1  . Years of education: 63   Occupational History  . business     office assistant   Social History Main Topics  . Smoking status: Never Smoker  . Smokeless tobacco: Never Used  . Alcohol use Yes     Comment: occ  . Drug use: No  . Sexual activity: Yes    Birth control/ protection: None   Other Topics Concern  . Not on file   Social History Narrative  . No narrative on file     Current Outpatient Prescriptions:  .  Cholecalciferol (VITAMIN D3  PO), Take 1 tablet by mouth daily., Disp: , Rfl:  .  ibuprofen (ADVIL,MOTRIN) 200 MG tablet, Take 400-800 mg by mouth every 8 (eight) hours as needed (for pain.)., Disp: , Rfl:  .  methyldopa (ALDOMET) 500 MG tablet, Take 1 tablet (500 mg total) by mouth 2 (two) times daily., Disp: 60 tablet, Rfl: 12 .  Multiple Vitamin (MULTIVITAMIN WITH MINERALS) TABS tablet, Take 1 tablet by mouth daily., Disp: , Rfl:  .  naproxen (NAPROSYN) 500 MG tablet, Take 1 tablet (500 mg total) by mouth 2 (two) times daily with a meal., Disp: 30 tablet, Rfl: 2 .  ORENCIA CLICKJECT 161 MG/ML SOAJ, INJECT ONE pen SUBCUTANEOUSLY every week, Disp: , Rfl: 6 .  sulfaSALAzine (AZULFIDINE) 500 MG tablet,  Take by mouth., Disp: , Rfl:   ROS:  Review of Systems  Constitutional: Negative for fever, malaise/fatigue and weight loss.  HENT: Negative for congestion, ear pain and sinus pain.   Respiratory: Negative for cough, shortness of breath and wheezing.   Cardiovascular: Negative for chest pain, orthopnea and leg swelling.  Gastrointestinal: Negative for constipation, diarrhea, nausea and vomiting.  Genitourinary: Negative for dysuria, frequency, hematuria and urgency.       Breast ROS: negative   Musculoskeletal: Positive for joint pain. Negative for back pain and myalgias.  Skin: Negative for itching and rash.  Neurological: Negative for dizziness, tingling, focal weakness and headaches.  Endo/Heme/Allergies: Negative for environmental allergies. Does not bruise/bleed easily.  Psychiatric/Behavioral: Negative for depression and suicidal ideas. The patient is not nervous/anxious and does not have insomnia.     Objective: BP 130/80   Pulse 62   Ht 5\' 4"  (1.626 m)   Wt 178 lb (80.7 kg)   LMP 06/22/2016   BMI 30.55 kg/m    Physical Exam  Constitutional: She is oriented to person, place, and time. She appears well-developed and well-nourished.  Genitourinary: Vagina normal and uterus normal. No erythema or tenderness in the vagina. No vaginal discharge found. Right adnexum does not display mass and does not display tenderness. Left adnexum does not display mass and does not display tenderness. Cervix does not exhibit motion tenderness or polyp. Uterus is not enlarged or tender.  Neck: Normal range of motion. No thyromegaly present.  Cardiovascular: Normal rate, regular rhythm and normal heart sounds.   No murmur heard. Pulmonary/Chest: Effort normal and breath sounds normal. Right breast exhibits no mass, no nipple discharge, no skin change and no tenderness. Left breast exhibits no mass, no nipple discharge, no skin change and no tenderness.  Abdominal: Soft. There is no tenderness.  There is no guarding.  Musculoskeletal: Normal range of motion.  Neurological: She is alert and oriented to person, place, and time. No cranial nerve deficit.  Psychiatric: She has a normal mood and affect. Her behavior is normal.  Vitals reviewed.   Assessment/Plan: Encounter for annual routine gynecological examination  Essential hypertension - Pt doing well on methyldopa with BP control. No longer trying to conceive, but wants to cont current BP med. Rx RF.   Menometrorrhagia - Long menses but light. Prog only BC options. Pt didnt' have cycle control with camila. Not interested in other [facility] options for cycle control. F/u prn. Rx anaprox             GYN counsel family planning choices, adequate intake of calcium and vitamin D     F/U  Return in about 1 year (around 07/18/2017).  Orlandria Kissner B. Hollan Philipp, PA-C 07/18/2016 12:05 PM

## 2016-09-10 ENCOUNTER — Telehealth: Payer: Self-pay

## 2016-09-10 ENCOUNTER — Other Ambulatory Visit: Payer: Self-pay | Admitting: Obstetrics and Gynecology

## 2016-09-10 MED ORDER — MEFENAMIC ACID 250 MG PO CAPS: ORAL_CAPSULE | ORAL | 2 refills | Status: DC

## 2016-09-10 NOTE — Telephone Encounter (Signed)
Pt calling for something stronger for cramps.  Naproxen 400mg  doesn't last more than 4hrs.  She is starting to cramp again.  Can she get something else?  [phone]

## 2016-09-10 NOTE — Progress Notes (Signed)
Pt with dysmen. Naproxen not helping. Will try ponstel.

## 2016-09-10 NOTE — Telephone Encounter (Signed)
Rx ponstel eRxd. RN to notify pt to try that to see if helpful.

## 2016-09-11 NOTE — Telephone Encounter (Signed)
Detailed msg left.

## 2016-12-23 ENCOUNTER — Ambulatory Visit: Payer: 59 | Admitting: Obstetrics and Gynecology

## 2016-12-24 ENCOUNTER — Ambulatory Visit (INDEPENDENT_AMBULATORY_CARE_PROVIDER_SITE_OTHER): Payer: 59 | Admitting: Obstetrics and Gynecology

## 2016-12-24 ENCOUNTER — Encounter: Payer: Self-pay | Admitting: Obstetrics and Gynecology

## 2016-12-24 VITALS — BP 118/74 | Ht 64.0 in | Wt 180.0 lb

## 2016-12-24 DIAGNOSIS — N920 Excessive and frequent menstruation with regular cycle: Secondary | ICD-10-CM

## 2016-12-24 DIAGNOSIS — N946 Dysmenorrhea, unspecified: Secondary | ICD-10-CM | POA: Diagnosis not present

## 2016-12-24 MED ORDER — NORETHINDRONE ACETATE 5 MG PO TABS: 5.0000 mg | ORAL_TABLET | Freq: Every day | ORAL | 2 refills | Status: DC

## 2016-12-24 NOTE — Progress Notes (Signed)
Obstetrics & Gynecology Office Visit   Chief Complaint  Patient presents with  . Advice Only  . Pelvic Pain  abnormal uterine bleeding  History of Present Illness: 36 y.o. G42P1001 female who presents for Abnormal uterine bleeding and pelvic pain. She has had the symptoms for the past 3 years. Pain is her #1 issue. The pain is sometimes gradual in onset. She states that it feels like a pulling down sensation that is intense.  Her pain is located in her bilateral lower quadrants and mid lower abdomen. It does not radiate. The pain is described as severe. The pain usually has its onset approximately 1 week before her menses, during the week of menses, and the week following. Occasionally the pain causes her to miss work in family activities. She has tried multiple treatments, including; ibuprofen, naproxen, and Ponstel. She gets the most relief from ibuprofen, however she has to take up to 4 pills every 4-5 hours to get maximal relief. She has also tried a menstrual cramp heating pad with some relief. Nothing seems to make the pain worse. She also notes that she has nearly constant spotting. She has regular cycles that last for several days. However there are only 3 or 4 days throughout the entire cycle where she has no bleeding at all. Prior to 3 years ago she had irregular menses with cramping, but nothing on the order which she is currently experiencing. She has undergone a D&C and hysteroscopy in December of both 2016 and 2017.  In 2016 the pathology returned as suggestive of a polyp without evidence of neoplasia. In 2017 the final pathology was similar. Her last pelvic ultrasound was in December 2017.  It showed a small fibroid without other obvious pathology.  Her last Pap smear was in February 2017 which was normal with negative HPV. In the past she has been trying for another pregnancy without success. Because of this, she has avoided hormonal medication. She states that she no longer desires  pregnancy and has given up and is not against treatments which will prevent pregnancy. She also denies a history of STDs. Her past surgical history states that she has had an ablation, however this is not true. Review of the operative reports indicates no ablation occurred.   Past Medical History:  Diagnosis Date  . ADD (attention deficit disorder)   . Anemia    iron deficiency anemia  . Arthritis    Rheumatoid arthritis  . Asthma    well-controlled-no inhalers  . GERD (gastroesophageal reflux disease)    occ- rolaids  . Headache   . History of Papanicolaou smear of cervix 04/16/2012   -/-  . Hyperlipemia   . Hypertension   . Pre-eclampsia   . Preeclampsia   . Vitamin D deficiency     Past Surgical History:  Procedure Laterality Date  . DILATATION & CURETTAGE/HYSTEROSCOPY WITH MYOSURE N/A 03/27/2016   Procedure: [place] WITH MYOSURE, MYOMECTOMY;  Surgeon: Gae Dry, MD;  Location: [facility] ORS;  Service: Gynecology;  Laterality: N/A;  . DILITATION & CURRETTAGE/HYSTROSCOPY WITH NOVASURE ABLATION N/A 03/09/2015   Procedure: DILATATION & CURETTAGE/HYSTEROSCOPY WITH NOVASURE ABLATION;  Surgeon: Gae Dry, MD;  Location: [facility] ORS;  Service: Gynecology;  Laterality: N/A;    Gynecologic History: Patient's last menstrual period was 12/13/2016.  Obstetric History: G1P1001  Family History  Problem Relation Age of Onset  . Hypertension Mother   . Hypertension Father   . Hyperlipidemia Father   . Lymphoma Father 55  non hodgkins   . Brain cancer Paternal Uncle   . Lung cancer Maternal Grandmother   . Breast cancer Paternal Aunt 5    Social History   Social History  . Marital status: Married    Spouse name: N/A  . Number of children: 1  . Years of education: 45   Occupational History  . business     office assistant   Social History Main Topics  . Smoking status: Never Smoker  . Smokeless tobacco: Never Used  . Alcohol use Yes      Comment: occ  . Drug use: No  . Sexual activity: Yes    Birth control/ protection: None   Other Topics Concern  . Not on file   Social History Narrative  . No narrative on file    Allergies  Allergen Reactions  . Ceclor [Cefaclor] Rash and Other (See Comments)    "unknown"  . Penicillins Rash    Has patient had a PCN reaction causing immediate rash, facial/tongue/throat swelling, SOB or lightheadedness with hypotension:unsure Has patient had a PCN reaction causing severe rash involving mucus membranes or skin necrosis:unsure Has patient had a PCN reaction that required hospitalization:No Has patient had a PCN reaction occurring within the last 10 years:No If all of the above answers are "NO", then may proceed with Cephalosporin use. Childhood reaction   . Prednisone Palpitations  . Sulfa Antibiotics Rash    Prior to Admission medications   Medication Sig Start Date End Date Taking? Authorizing Provider  Cholecalciferol (VITAMIN D3 PO) Take 1 tablet by mouth daily.   Yes [provider]  methyldopa (ALDOMET) 500 MG tablet Take 1 tablet (500 mg total) by mouth 2 (two) times daily. 3/76/28  Yes Copland, Deirdre Evener, PA-C  Multiple Vitamin (MULTIVITAMIN WITH MINERALS) TABS tablet Take 1 tablet by mouth daily.   Yes [provider]  Maureen Chatters CLICKJECT 315 MG/ML SOAJ INJECT ONE pen SUBCUTANEOUSLY every week 06/10/16  Yes [provider]  ibuprofen (ADVIL,MOTRIN) 200 MG tablet Take 400-800 mg by mouth every 8 (eight) hours as needed (for pain.).    [provider]  Mefenamic Acid (PONSTEL) 250 MG CAPS 2 capsules (500 mg) po once and the onset of menses, then 1 capsule (250 mg) every 6 hours prn cramping max 3 days Patient not taking: Reported on 12/24/2016 04/15/59   Copland, Elmo Putt B, PA-C  naproxen (NAPROSYN) 500 MG tablet Take 1 tablet (500 mg total) by mouth 2 (two) times daily with a meal. Patient not taking: Reported on 12/24/2016 09/13/35   Copland,  Alicia B, PA-C  sulfaSALAzine (AZULFIDINE) 500 MG tablet Take by mouth. 08/24/15   [provider]    Review of Systems  Constitutional: Negative.   HENT: Negative.   Eyes: Negative.   Respiratory: Negative.   Cardiovascular: Negative.   Gastrointestinal: Positive for abdominal pain and heartburn. Negative for blood in stool, constipation, diarrhea, melena, nausea and vomiting.  Genitourinary: Negative.  Negative for dysuria and hematuria.       Otherwise per HPI.   Musculoskeletal: Negative.   Skin: Negative.   Neurological: Negative.   Psychiatric/Behavioral: Negative.      Physical Exam BP 118/74   Ht 5\' 4"  (1.626 m)   Wt 180 lb (81.6 kg)   LMP 12/13/2016   BMI 30.90 kg/m  Patient's last menstrual period was 12/13/2016. Physical Exam  Constitutional: She is oriented to person, place, and time. She appears well-developed and well-nourished. No distress.  HENT:  Head:  Normocephalic and atraumatic.  Eyes: EOM are normal. No scleral icterus.  Neck: Normal range of motion. Neck supple.  Cardiovascular: Normal rate and regular rhythm.   Pulmonary/Chest: Effort normal and breath sounds normal. No respiratory distress. She has no wheezes. She has no rales.  Abdominal: Soft. Bowel sounds are normal. She exhibits no distension and no mass. There is no tenderness. There is no rebound and no guarding.  Musculoskeletal: Normal range of motion. She exhibits no edema.  Neurological: She is alert and oriented to person, place, and time. No cranial nerve deficit.  Skin: Skin is warm and dry. No erythema.  Psychiatric: She has a normal mood and affect. Her behavior is normal. Judgment normal.   Assessment: 36 y.o. G45P1001 female with menorrhagia with regular cycle, but nearly constant spotting.  She also has pelvic pain associated with but not limited to her menses.    Plan: Problem List Items Addressed This Visit    Menorrhagia - Primary   Relevant Medications   norethindrone  (AYGESTIN) 5 MG tablet   Dysmenorrhea   Relevant Medications   norethindrone (AYGESTIN) 5 MG tablet     We discussed multiple etiologies of her irregular bleeding and her pain. Given she has had 2 hysteroscopy with dilation and curettage, both revealing possible fragments of a polyp, it is possible she is reformed polyp. However this would make nearly 3 years in a row where a similar problem has occurred. Given that her pain is her #1 issue, we discussed pain etiologies, including; gastrointestinal, urologic, musculoskeletal, psychiatric, and gynecologic. Given the overall picture, her pain could be most consistent with potentially endometriosis and/or adenomyosis. After thorough discussion regarding diagnosis and treatment options, mutual decision is made to treat presumptively for endometriosis with norethindrone 5 mg by mouth daily. If she fails this therapy, we will need to obtain more up-to-date pelvic ultrasound and possibly endometrial biopsy. Consideration for definitive surgical management may need to be discussed if she does not respond to any of the above measures. She may also need further workup from other subspecialist to investigate other etiologies of pain.  20 minutes spent in face to face discussion with > 50% spent in counseling and management of her dysmenorrhea and menorrhagia with irregular cycles.  Return in about 2 months (around 02/23/2017) for follow up medication.  Prentice Docker, MD 12/24/2016 3:55 PM

## 2017-01-17 ENCOUNTER — Telehealth: Payer: Self-pay

## 2017-01-17 NOTE — Telephone Encounter (Signed)
Pt is on progesterone med, has been on it for two weeks now.  It's making her endometriosis sxs worse and affecting her cycle which she was told it would.  She has had to leave work twice this week b/c of the pain.  Needs to know how long this will last?  Does she need to tough it out?  Is there something else she can try?  If she does need to tough it out, can she have something for pain?  Uses [facility] in [place].  [phone]

## 2017-01-17 NOTE — Telephone Encounter (Signed)
She can try to continue the medication, or if it is making things worse, she can discontinue the medication for now until we have another plan in place. I would like to see her for a pelvic ultrasound as soon as we can get her back in.  From there we can discuss next steps.  Can you help get her in ASAP for a pelvic u/s with me? If so, let me know when and I'll put in the order for the ultrasound.

## 2017-01-17 NOTE — Telephone Encounter (Signed)
Pt aware and tx'd to SP for scheduling. 

## 2017-01-23 ENCOUNTER — Other Ambulatory Visit: Payer: Self-pay | Admitting: Obstetrics and Gynecology

## 2017-01-23 DIAGNOSIS — N920 Excessive and frequent menstruation with regular cycle: Secondary | ICD-10-CM

## 2017-01-27 ENCOUNTER — Ambulatory Visit (INDEPENDENT_AMBULATORY_CARE_PROVIDER_SITE_OTHER): Payer: 59 | Admitting: Obstetrics and Gynecology

## 2017-01-27 ENCOUNTER — Ambulatory Visit (INDEPENDENT_AMBULATORY_CARE_PROVIDER_SITE_OTHER): Payer: 59

## 2017-01-27 VITALS — BP 114/76 | Ht 64.0 in | Wt 176.0 lb

## 2017-01-27 DIAGNOSIS — N7011 Chronic salpingitis: Secondary | ICD-10-CM | POA: Diagnosis not present

## 2017-01-27 DIAGNOSIS — N809 Endometriosis, unspecified: Secondary | ICD-10-CM | POA: Diagnosis not present

## 2017-01-27 DIAGNOSIS — G8929 Other chronic pain: Secondary | ICD-10-CM

## 2017-01-27 DIAGNOSIS — N920 Excessive and frequent menstruation with regular cycle: Secondary | ICD-10-CM

## 2017-01-27 DIAGNOSIS — R102 Pelvic and perineal pain: Principal | ICD-10-CM

## 2017-01-27 DIAGNOSIS — N80129 Deep endometriosis of ovary, unspecified ovary: Secondary | ICD-10-CM | POA: Insufficient documentation

## 2017-01-27 NOTE — Progress Notes (Addendum)
Gynecology Ultrasound Follow Up   Chief Complaint  Patient presents with  . Follow-up  Pelvic pain, abnormal bleeding.  History of Present Illness: Patient is a 36 y.o. female who presents today for ultrasound evaluation of the above .  Ultrasound demonstrates the following findings Adnexa: bilateral hydrosalpinges, bilateral endometrioma (likely)  Uterus: anteverted with endometrial stripe  6.7 mm Additional: no other findings Was started on norethindrone after last visit. Pain and bleeding worse.  Now has stopped and feels somewhat better.   Past Medical History:  Diagnosis Date  . ADD (attention deficit disorder)   . Anemia    iron deficiency anemia  . Arthritis    Rheumatoid arthritis  . Asthma    well-controlled-no inhalers  . GERD (gastroesophageal reflux disease)    occ- rolaids  . Headache   . History of Papanicolaou smear of cervix 04/16/2012   -/-  . Hyperlipemia   . Hypertension   . Pre-eclampsia   . Preeclampsia   . Vitamin D deficiency     Past Surgical History:  Procedure Laterality Date  . DILATATION & CURETTAGE/HYSTEROSCOPY WITH MYOSURE, MYOMECTOMY N/A 03/27/2016   Performed by Gae Dry, MD at [facility] ORS  . DILATATION & CURETTAGE/HYSTEROSCOPY WITH NOVASURE ABLATION N/A 03/09/2015   Performed by Gae Dry, MD at [facility] ORS    Family History  Problem Relation Age of Onset  . Hypertension Mother   . Hypertension Father   . Hyperlipidemia Father   . Lymphoma Father 33       non hodgkins   . Brain cancer Paternal Uncle   . Lung cancer Maternal Grandmother   . Breast cancer Paternal Aunt 34    Social History   Socioeconomic History  . Marital status: Married    Spouse name: Not on file  . Number of children: 1  . Years of education: 29  . Highest education level: Not on file  Social Needs  . Financial resource strain: Not on file  . Food insecurity - worry: Not on file  . Food insecurity - inability: Not on file  .  Transportation needs - medical: Not on file  . Transportation needs - non-medical: Not on file  Occupational History  . Occupation: business    Comment: Surveyor, minerals  Tobacco Use  . Smoking status: Never Smoker  . Smokeless tobacco: Never Used  Substance and Sexual Activity  . Alcohol use: Yes    Comment: occ  . Drug use: No  . Sexual activity: Yes    Birth control/protection: None  Other Topics Concern  . Not on file  Social History Narrative  . Not on file    Allergies  Allergen Reactions  . Ceclor [Cefaclor] Rash and Other (See Comments)    "unknown"  . Penicillins Rash and Other (See Comments)    Has patient had a PCN reaction causing immediate rash, facial/tongue/throat swelling, SOB or lightheadedness with hypotension:unsure Has patient had a PCN reaction causing severe rash involving mucus membranes or skin necrosis:unsure Has patient had a PCN reaction that required hospitalization:No Has patient had a PCN reaction occurring within the last 10 years:No If all of the above answers are "NO", then may proceed with Cephalosporin use. Childhood reaction   . Prednisone Palpitations  . Sulfa Antibiotics Rash    Prior to Admission medications   Medication Sig Start Date End Date Taking? Authorizing Provider  Cholecalciferol (VITAMIN D3 PO) Take 1 tablet by mouth daily.    [provider]  methyldopa (ALDOMET) 500 MG tablet Take 1 tablet (500 mg total) by mouth 2 (two) times daily. 4/82/70   Copland, Deirdre Evener, PA-C  Multiple Vitamin (MULTIVITAMIN WITH MINERALS) TABS tablet Take 1 tablet by mouth daily.    [provider]  Maureen Chatters CLICKJECT 786 MG/ML SOAJ INJECT ONE pen SUBCUTANEOUSLY every week 06/10/16   [provider]  sulfaSALAzine (AZULFIDINE) 500 MG tablet Take by mouth. 08/24/15   [provider]    Physical Exam BP 114/76   Ht 5\' 4"  (1.626 m)   Wt 176 lb (79.8 kg)   BMI 30.21 kg/m    General: NAD HEENT: normocephalic,  anicteric Pulmonary: No increased work of breathing Extremities: no edema, erythema, or tenderness Neurologic: Grossly intact, normal gait Psychiatric: mood appropriate, affect full   Assessment: 36 y.o. G1P1001 with  1. Chronic pelvic pain in female   2. Endometrioma   3. Hydrosalpinx      Plan: Problem List Items Addressed This Visit    Endometrioma   Hydrosalpinx    Other Visit Diagnoses    Chronic pelvic pain in female    -  Primary     Given these significant findings on ultrasound, I recommended that the patient undergo surgery to correct the issue.  The patient states that she still no longer desires to become pregnant.  However, the findings on ultrasound could explain her difficulties with becoming pregnant.  The other benefit of the surgery, in addition to alleviating her pain, would be that she may have increased chance of success and becoming pregnant.  The recommended surgery is bilateral salpingectomy to remove the hydrosalpinges.  Also bilateral ovarian cystectomy would be recommended as surgery.  In the setting of pain and abnormal bleeding, the surgery may provide her significant relief.  It appears she has endometriosis and removal of endometriosis implants may also improve her pain.  If she does desire fertility after the surgery, she will have the option of in vitro fertilization perhaps and the surgery may improve her chances of success with that modality of Conception.  20 minutes spent in face to face discussion with > 50% spent in counseling,management, and coordination of care of her chronic pelvic pain now with a diagnosis of bilateral hydrosalpinges and bilateral endometriomas.  As a result of this visit, we will schedule surgery as noted above.  All patient questions answered and she does wish to proceed.  Prentice Docker, MD 02/24/2017 3:56 PM

## 2017-01-27 NOTE — Patient Instructions (Signed)
Endometriosis Endometriosis is a condition in which the tissue that lines the uterus (endometrium) grows outside of its normal location. The tissue may grow in many locations close to the uterus, but it commonly grows on the ovaries, fallopian tubes, vagina, or bowel. When the uterus sheds the endometrium every menstrual cycle, there is bleeding wherever the endometrial tissue is located. This can cause pain because blood is irritating to tissues that are not normally exposed to it. What are the causes? The cause of endometriosis is not known. What increases the risk? You may be more likely to develop endometriosis if you:  Have a family history of endometriosis.  Have never given birth.  Started your period at age 10 or younger.  Have high levels of estrogen in your body.  Were exposed to a certain medicine (diethylstilbestrol) before you were born (in utero).  Had low birth weight.  Were born as a twin, triplet, or other multiple.  Have a BMI of less than 25. BMI is an estimate of body fat and is calculated from height and weight.  What are the signs or symptoms? Often, there are no symptoms of this condition. If you do have symptoms, they may:  Vary depending on where your endometrial tissue is growing.  Occur during your menstrual period (most common) or midcycle.  Come and go, or you may go months with no symptoms at all.  Stop with menopause.  Symptoms may include:  Pain in the back or abdomen.  Heavier bleeding during periods.  Pain during sex.  Painful bowel movements.  Infertility.  Pelvic pain.  Bleeding more than once a month.  How is this diagnosed? This condition is diagnosed based on your symptoms and a physical exam. You may have tests, such as:  Blood tests and urine tests. These may be done to help rule out other possible causes of your symptoms.  Ultrasound, to look for abnormal tissues.  An X-ray of the lower bowel (barium enema).  An  ultrasound that is done through the vagina (transvaginally).  CT scan.  MRI.  Laparoscopy. In this procedure, a lighted, pencil-sized instrument called a laparoscope is inserted into your abdomen through an incision. The laparoscope allows your health care provider to look at the organs inside your body and check for abnormal tissue to confirm the diagnosis. If abnormal tissue is found, your health care provider may remove a small piece of tissue (biopsy) to be examined under a microscope.  How is this treated? Treatment for this condition may include:  Medicines to relieve pain, such as NSAIDs.  Hormone therapy. This involves using artificial (synthetic) hormones to reduce endometrial tissue growth. Your health care provider may recommend using a hormonal form of birth control, or other medicines.  Surgery. This may be done to remove abnormal endometrial tissue. ? In some cases, tissue may be removed using a laparoscope and a laser (laparoscopic laser treatment). ? In severe cases, surgery may be done to remove the fallopian tubes, uterus, and ovaries (hysterectomy).  Follow these instructions at home:  Take over-the-counter and prescription medicines only as told by your health care provider.  Do not drive or use heavy machinery while taking prescription pain medicine.  Try to avoid activities that cause pain, including sexual activity.  Keep all follow-up visits as told by your health care provider. This is important. Contact a health care provider if:  You have pain in the area between your hip bones (pelvic area) that occurs: ? Before, during, or   after your period. ? In between your period and gets worse during your period. ? During or after sex. ? With bowel movements or urination, especially during your period.  You have problems getting pregnant.  You have a fever. Get help right away if:  You have severe pain that does not get better with medicine.  You have severe  nausea and vomiting, or you cannot eat without vomiting.  You have pain that affects only the lower, right side of your abdomen.  You have abdominal pain that gets worse.  You have abdominal swelling.  You have blood in your stool. This information is not intended to replace advice given to you by your health care provider. Make sure you discuss any questions you have with your health care provider. Document Released: 03/22/2000 Document Revised: 12/29/2015 Document Reviewed: 08/26/2015 Elsevier Interactive Patient Education  2018 Elsevier Inc.  

## 2017-01-28 ENCOUNTER — Encounter: Payer: Self-pay | Admitting: Obstetrics and Gynecology

## 2017-01-28 ENCOUNTER — Telehealth: Payer: Self-pay | Admitting: Obstetrics and Gynecology

## 2017-01-28 NOTE — Telephone Encounter (Signed)
Patient is aware of H&P on 02/24/17 @ 3:30pm (appointment was previously a medication f/u but per patient that appt has already been rescheduled and completed), Pre-admit Testing phone interview to be scheduled, and OR on 03/06/17. Ext given.

## 2017-01-28 NOTE — Telephone Encounter (Signed)
-----   Message from Will Bonnet, MD sent at 01/28/2017 10:43 AM EDT ----- Regarding: Schedule surgery Surgery Booking Request Patient Full Name:  Angelica Banks  MRN: [id number]  DOB: 26-Jul-1980  Surgeon: Prentice Docker, MD  Requested Surgery Date and Time: per patient Primary Diagnosis AND Code: endometrioma (bilateral), bilateral hydrosalpinges, chronic pelvic pain Secondary Diagnosis and Code:  Surgical Procedure: operative laparoscopy, bilateral ovarian cystectomy, bilateral salpingectomy, removal of any abnormal tissue noted. L&D Notification: No Admission Status: same day surgery Length of Surgery: 65 minutes Special Case Needs: none H&P: tbd (date) Phone Interview???: Possibly Interpreter: Language:  Medical Clearance: none Special Scheduling Instructions: none

## 2017-02-24 ENCOUNTER — Encounter: Payer: Self-pay | Admitting: Obstetrics and Gynecology

## 2017-02-24 ENCOUNTER — Ambulatory Visit (INDEPENDENT_AMBULATORY_CARE_PROVIDER_SITE_OTHER): Payer: 59 | Admitting: Obstetrics and Gynecology

## 2017-02-24 VITALS — BP 118/74 | Ht 64.0 in | Wt 176.0 lb

## 2017-02-24 DIAGNOSIS — N920 Excessive and frequent menstruation with regular cycle: Secondary | ICD-10-CM

## 2017-02-24 DIAGNOSIS — N80129 Deep endometriosis of ovary, unspecified ovary: Secondary | ICD-10-CM

## 2017-02-24 DIAGNOSIS — N946 Dysmenorrhea, unspecified: Secondary | ICD-10-CM

## 2017-02-24 DIAGNOSIS — N7011 Chronic salpingitis: Secondary | ICD-10-CM

## 2017-02-24 DIAGNOSIS — N809 Endometriosis, unspecified: Secondary | ICD-10-CM

## 2017-02-24 NOTE — Progress Notes (Signed)
Preoperative History and Physical  Angelica Banks is a 36 y.o. G1P1001 here for surgical management of chronic pelvic pain, bilateral endometriomas, bilateral hydrosalpinges.   No significant preoperative concerns.  History of Present Illness: 36 y.o. G60P1001 female who presents for Abnormal uterine bleeding and pelvic pain. She has had the symptoms for the past 3 years. Pain is her #1 issue. The pain is sometimes gradual in onset. She states that it feels like a pulling down sensation that is intense.  Her pain is located in her bilateral lower quadrants and mid lower abdomen. It does not radiate. The pain is described as severe. The pain usually has its onset approximately 1 week before her menses, during the week of menses, and the week following. Occasionally the pain causes her to miss work in family activities. She has tried multiple treatments, including; ibuprofen, naproxen, and Ponstel. She gets the most relief from ibuprofen, however she has to take up to 4 pills every 4-5 hours to get maximal relief. She has also tried a menstrual cramp heating pad with some relief. Nothing seems to make the pain worse. She also notes that she has nearly constant spotting. She has regular cycles that last for several days. However there are only 3 or 4 days throughout the entire cycle where she has no bleeding at all. Prior to 3 years ago she had irregular menses with cramping, but nothing on the order which she is currently experiencing. She has undergone a D&C and hysteroscopy in December of both 2016 and 2017.  In 2016 the pathology returned as suggestive of a polyp without evidence of neoplasia. In 2017 the final pathology was similar. Her last pelvic ultrasound was in December 2017.  It showed a small fibroid without other obvious pathology.  Her last Pap smear was in February 2017 which was normal with negative HPV. In the past she has been trying for another pregnancy without success. Because of this, she  has avoided hormonal medication. She states that she no longer desires pregnancy and has given up and is not against treatments which will prevent pregnancy. She also denies a history of STDs. Her past surgical history states that she has had an ablation, however this is not true. Review of the operative reports indicates no ablation occurred.  Ultrasound report showed the following:  Adnexa: bilateral hydrosalpinges, bilateral endometrioma (likely)  Uterus: anteverted with endometrial stripe  6.7 mm Additional: no other findings Was started on norethindrone after last visit. Pain and bleeding worse.  Now has stopped and feels somewhat better.   Proposed surgery: operative laparoscopy, bilateral salpingectomy, bilateral ovarian cystectomy, removal of abnormal tissue.   Past Medical History:  Diagnosis Date  . ADD (attention deficit disorder)   . Anemia    iron deficiency anemia  . Arthritis    Rheumatoid arthritis  . Asthma    well-controlled-no inhalers  . GERD (gastroesophageal reflux disease)    occ- rolaids  . Headache   . History of Papanicolaou smear of cervix 04/16/2012   -/-  . Hyperlipemia   . Hypertension   . Pre-eclampsia   . Preeclampsia   . Vitamin D deficiency    Past Surgical History:  Procedure Laterality Date  . DILATATION & CURETTAGE/HYSTEROSCOPY WITH MYOSURE, MYOMECTOMY N/A 03/27/2016   Performed by Gae Dry, MD at [facility] ORS  . DILATATION & CURETTAGE/HYSTEROSCOPY WITH NOVASURE ABLATION N/A 03/09/2015   Performed by Gae Dry, MD at [facility] ORS   OB History  Angelica Banks  Para Term Preterm AB Living  1 1 1     1   SAB TAB Ectopic Multiple Live Births          1    # Outcome Date GA Lbr Len/2nd Weight Sex Delivery Anes PTL Lv  1 Term 11/16/07 [redacted]w[redacted]d  5 lb 13 oz (2.637 kg) M Vag-Spont   LIV     Birth Comments: pre-eclampsia    Patient denies any other pertinent gynecologic issues.   Current Outpatient Medications on File Prior to Visit  Medication  Sig Dispense Refill  . Cholecalciferol (VITAMIN D3 PO) Take 1 tablet by mouth daily.    Marland Kitchen ibuprofen (ADVIL,MOTRIN) 200 MG tablet Take 800 mg every 8 (eight) hours as needed by mouth for moderate pain.     . Multiple Vitamin (MULTIVITAMIN WITH MINERALS) TABS tablet Take 1 tablet by mouth daily.    Marland Kitchen ORENCIA CLICKJECT 440 MG/ML SOAJ INJECT ONE pen SUBCUTANEOUSLY every week  6  . ranitidine (ZANTAC) 150 MG tablet Take 150 mg daily as needed by mouth for heartburn.     No current facility-administered medications on file prior to visit.    Allergies  Allergen Reactions  . Ceclor [Cefaclor] Rash and Other (See Comments)    "unknown"  . Penicillins Rash and Other (See Comments)    Has patient had a PCN reaction causing immediate rash, facial/tongue/throat swelling, SOB or lightheadedness with hypotension:unsure Has patient had a PCN reaction causing severe rash involving mucus membranes or skin necrosis:unsure Has patient had a PCN reaction that required hospitalization:No Has patient had a PCN reaction occurring within the last 10 years:No If all of the above answers are "NO", then may proceed with Cephalosporin use. Childhood reaction   . Prednisone Palpitations  . Sulfa Antibiotics Rash    Social History:   reports that  has never smoked. she has never used smokeless tobacco. She reports that she drinks alcohol. She reports that she does not use drugs.  Family History  Problem Relation Banks of Onset  . Hypertension Mother   . Hypertension Father   . Hyperlipidemia Father   . Lymphoma Father 21       non hodgkins   . Brain cancer Paternal Uncle   . Lung cancer Maternal Grandmother   . Breast cancer Paternal Aunt 12    Review of Systems: Noncontributory  PHYSICAL EXAM: Blood pressure 118/74, height 5\' 4"  (1.626 m), weight 176 lb (79.8 kg), last menstrual period 02/14/2017. CONSTITUTIONAL: Well-developed, well-nourished female in no acute distress.  HENT:  Normocephalic,  atraumatic, External right and left ear normal. Oropharynx is clear and moist EYES: Conjunctivae and EOM are normal. Pupils are equal, round, and reactive to light. No scleral icterus.  NECK: Normal range of motion, supple, no masses SKIN: Skin is warm and dry. No rash noted. Not diaphoretic. No erythema. No pallor. [place]: Alert and oriented to person, place, and time. Normal reflexes, muscle tone coordination. No cranial nerve deficit noted. PSYCHIATRIC: Normal mood and affect. Normal behavior. Normal judgment and thought content. CARDIOVASCULAR: Normal heart rate noted, regular rhythm RESPIRATORY: Effort and breath sounds normal, no problems with respiration noted ABDOMEN: Soft, nontender, nondistended. PELVIC: Deferred MUSCULOSKELETAL: Normal range of motion. No edema and no tenderness. 2+ distal pulses.  Assessment: Patient Active Problem List   Diagnosis Date Noted  . Endometrioma 01/27/2017  . Hydrosalpinx 01/27/2017  . Dysmenorrhea 12/24/2016  . Menorrhagia 03/09/2015    Plan: Patient will undergo surgical management with operative laparoscopy, bilateral salpingectomy, bilateral  ovarian cystectomy, removal of abnormal tissue.   The risks of surgery were discussed in detail with the patient including but not limited to: bleeding which may require transfusion or reoperation; infection which may require antibiotics; injury to surrounding organs which may involve bowel, bladder, ureters ; need for additional procedures including laparoscopy or laparotomy; thromboembolic phenomenon, surgical site problems and other postoperative/anesthesia complications. Likelihood of success in alleviating the patient's condition was discussed. Routine postoperative instructions will be reviewed with the patient and her family in detail after surgery.  The patient concurred with the proposed plan, giving informed written consent for the surgery.  Anesthesia and OR aware.  Preoperative prophylactic  antibiotics and SCDs ordered on call to the OR, as appropriate.    Prentice Docker, MD 02/24/2017 6:05 PM

## 2017-02-24 NOTE — H&P (View-Only) (Signed)
Preoperative History and Physical  Angelica Banks is a 36 y.o. G1P1001 here for surgical management of chronic pelvic pain, bilateral endometriomas, bilateral hydrosalpinges.   No significant preoperative concerns.  History of Present Illness: 36 y.o. G80P1001 female who presents for Abnormal uterine bleeding and pelvic pain. She has had the symptoms for the past 3 years. Pain is her #1 issue. The pain is sometimes gradual in onset. She states that it feels like a pulling down sensation that is intense.  Her pain is located in her bilateral lower quadrants and mid lower abdomen. It does not radiate. The pain is described as severe. The pain usually has its onset approximately 1 week before her menses, during the week of menses, and the week following. Occasionally the pain causes her to miss work in family activities. She has tried multiple treatments, including; ibuprofen, naproxen, and Ponstel. She gets the most relief from ibuprofen, however she has to take up to 4 pills every 4-5 hours to get maximal relief. She has also tried a menstrual cramp heating pad with some relief. Nothing seems to make the pain worse. She also notes that she has nearly constant spotting. She has regular cycles that last for several days. However there are only 3 or 4 days throughout the entire cycle where she has no bleeding at all. Prior to 3 years ago she had irregular menses with cramping, but nothing on the order which she is currently experiencing. She has undergone a D&C and hysteroscopy in December of both 2016 and 2017.  In 2016 the pathology returned as suggestive of a polyp without evidence of neoplasia. In 2017 the final pathology was similar. Her last pelvic ultrasound was in December 2017.  It showed a small fibroid without other obvious pathology.  Her last Pap smear was in February 2017 which was normal with negative HPV. In the past she has been trying for another pregnancy without success. Because of this, she  has avoided hormonal medication. She states that she no longer desires pregnancy and has given up and is not against treatments which will prevent pregnancy. She also denies a history of STDs. Her past surgical history states that she has had an ablation, however this is not true. Review of the operative reports indicates no ablation occurred.  Ultrasound report showed the following:  Adnexa: bilateral hydrosalpinges, bilateral endometrioma (likely)  Uterus: anteverted with endometrial stripe  6.7 mm Additional: no other findings Was started on norethindrone after last visit. Pain and bleeding worse.  Now has stopped and feels somewhat better.   Proposed surgery: operative laparoscopy, bilateral salpingectomy, bilateral ovarian cystectomy, removal of abnormal tissue.   Past Medical History:  Diagnosis Date  . ADD (attention deficit disorder)   . Anemia    iron deficiency anemia  . Arthritis    Rheumatoid arthritis  . Asthma    well-controlled-no inhalers  . GERD (gastroesophageal reflux disease)    occ- rolaids  . Headache   . History of Papanicolaou smear of cervix 04/16/2012   -/-  . Hyperlipemia   . Hypertension   . Pre-eclampsia   . Preeclampsia   . Vitamin D deficiency    Past Surgical History:  Procedure Laterality Date  . DILATATION & CURETTAGE/HYSTEROSCOPY WITH MYOSURE, MYOMECTOMY N/A 03/27/2016   Performed by Gae Dry, MD at [facility] ORS  . DILATATION & CURETTAGE/HYSTEROSCOPY WITH NOVASURE ABLATION N/A 03/09/2015   Performed by Gae Dry, MD at [facility] ORS   OB History  Angelica Banks  Para Term Preterm AB Living  1 1 1     1   SAB TAB Ectopic Multiple Live Births          1    # Outcome Date GA Lbr Len/2nd Weight Sex Delivery Anes PTL Lv  1 Term 11/16/07 [redacted]w[redacted]d  5 lb 13 oz (2.637 kg) M Vag-Spont   LIV     Birth Comments: pre-eclampsia    Patient denies any other pertinent gynecologic issues.   Current Outpatient Medications on File Prior to Visit  Medication  Sig Dispense Refill  . Cholecalciferol (VITAMIN D3 PO) Take 1 tablet by mouth daily.    Marland Kitchen ibuprofen (ADVIL,MOTRIN) 200 MG tablet Take 800 mg every 8 (eight) hours as needed by mouth for moderate pain.     . Multiple Vitamin (MULTIVITAMIN WITH MINERALS) TABS tablet Take 1 tablet by mouth daily.    Marland Kitchen ORENCIA CLICKJECT 607 MG/ML SOAJ INJECT ONE pen SUBCUTANEOUSLY every week  6  . ranitidine (ZANTAC) 150 MG tablet Take 150 mg daily as needed by mouth for heartburn.     No current facility-administered medications on file prior to visit.    Allergies  Allergen Reactions  . Ceclor [Cefaclor] Rash and Other (See Comments)    "unknown"  . Penicillins Rash and Other (See Comments)    Has patient had a PCN reaction causing immediate rash, facial/tongue/throat swelling, SOB or lightheadedness with hypotension:unsure Has patient had a PCN reaction causing severe rash involving mucus membranes or skin necrosis:unsure Has patient had a PCN reaction that required hospitalization:No Has patient had a PCN reaction occurring within the last 10 years:No If all of the above answers are "NO", then may proceed with Cephalosporin use. Childhood reaction   . Prednisone Palpitations  . Sulfa Antibiotics Rash    Social History:   reports that  has never smoked. she has never used smokeless tobacco. She reports that she drinks alcohol. She reports that she does not use drugs.  Family History  Problem Relation Banks of Onset  . Hypertension Mother   . Hypertension Father   . Hyperlipidemia Father   . Lymphoma Father 79       non hodgkins   . Brain cancer Paternal Uncle   . Lung cancer Maternal Grandmother   . Breast cancer Paternal Aunt 83    Review of Systems: Noncontributory  PHYSICAL EXAM: Blood pressure 118/74, height 5\' 4"  (1.626 m), weight 176 lb (79.8 kg), last menstrual period 02/14/2017. CONSTITUTIONAL: Well-developed, well-nourished female in no acute distress.  HENT:  Normocephalic,  atraumatic, External right and left ear normal. Oropharynx is clear and moist EYES: Conjunctivae and EOM are normal. Pupils are equal, round, and reactive to light. No scleral icterus.  NECK: Normal range of motion, supple, no masses SKIN: Skin is warm and dry. No rash noted. Not diaphoretic. No erythema. No pallor. [place]: Alert and oriented to person, place, and time. Normal reflexes, muscle tone coordination. No cranial nerve deficit noted. PSYCHIATRIC: Normal mood and affect. Normal behavior. Normal judgment and thought content. CARDIOVASCULAR: Normal heart rate noted, regular rhythm RESPIRATORY: Effort and breath sounds normal, no problems with respiration noted ABDOMEN: Soft, nontender, nondistended. PELVIC: Deferred MUSCULOSKELETAL: Normal range of motion. No edema and no tenderness. 2+ distal pulses.  Assessment: Patient Active Problem List   Diagnosis Date Noted  . Endometrioma 01/27/2017  . Hydrosalpinx 01/27/2017  . Dysmenorrhea 12/24/2016  . Menorrhagia 03/09/2015    Plan: Patient will undergo surgical management with operative laparoscopy, bilateral salpingectomy, bilateral  ovarian cystectomy, removal of abnormal tissue.   The risks of surgery were discussed in detail with the patient including but not limited to: bleeding which may require transfusion or reoperation; infection which may require antibiotics; injury to surrounding organs which may involve bowel, bladder, ureters ; need for additional procedures including laparoscopy or laparotomy; thromboembolic phenomenon, surgical site problems and other postoperative/anesthesia complications. Likelihood of success in alleviating the patient's condition was discussed. Routine postoperative instructions will be reviewed with the patient and her family in detail after surgery.  The patient concurred with the proposed plan, giving informed written consent for the surgery.  Anesthesia and OR aware.  Preoperative prophylactic  antibiotics and SCDs ordered on call to the OR, as appropriate.    Prentice Docker, MD 02/24/2017 6:05 PM

## 2017-02-25 ENCOUNTER — Encounter
Admission: RE | Admit: 2017-02-25 | Discharge: 2017-02-25 | Disposition: A | Discharge status: Home / Self Care | Payer: 59 | Source: Ambulatory Visit | Attending: Obstetrics and Gynecology | Admitting: Obstetrics and Gynecology

## 2017-02-25 ENCOUNTER — Other Ambulatory Visit: Payer: Self-pay

## 2017-02-25 NOTE — Patient Instructions (Signed)
  Your procedure is scheduled on: 03-06-17 THURSDAY Report to Same Day Surgery 2nd floor medical mall [facility] Entrance-take elevator on left to 2nd floor.  Check in with surgery information desk.) To find out your arrival time please call [phone] between 1PM - 3PM on 03-05-17 [facility]  Remember: Instructions that are not followed completely may result in serious medical risk, up to and including death, or upon the discretion of your surgeon and anesthesiologist your surgery may need to be rescheduled.    _x___ 1. Do not eat food after midnight the night before your procedure. NO GUM CHEWING OR CANDY AFTER MIDNIGHT.  You may drink clear liquids up to 2 hours before you are scheduled to arrive at the hospital for your procedure.  Do not drink clear liquids within 2 hours of your scheduled arrival to the hospital.  Clear liquids include  --Water or Apple juice without pulp  --Clear carbohydrate beverage such as ClearFast or Gatorade  --Black Coffee or Clear Tea (No milk, no creamers, do not add anything to the coffee or Tea      __x__ 2. No Alcohol for 24 hours before or after surgery.   __x__3. No Smoking for 24 prior to surgery.   ____  4. Bring all medications with you on the day of surgery if instructed.    __x__ 5. Notify your doctor if there is any change in your medical condition     (cold, fever, infections).     Do not wear jewelry, make-up, hairpins, clips or nail polish.  Do not wear lotions, powders, or perfumes. You may wear deodorant.  Do not shave 48 hours prior to surgery. Men may shave face and neck.  Do not bring valuables to the hospital.    [facility] is not responsible for any belongings or valuables.               Contacts, dentures or bridgework may not be worn into surgery.  Leave your suitcase in the car. After surgery it may be brought to your room.  For patients admitted to the hospital, discharge time is determined by your treatment  team.   Patients discharged the day of surgery will not be allowed to drive home.  You will need someone to drive you home and stay with you the night of your procedure.    Please read over the following fact sheets that you were given:   [facility] Preparing for Surgery and or MRSA Information   _x___ TAKE THE FOLLOWING MEDICATION THE MORNING OF SURGERY WITH A SMALL SIP OF WATER. These include:  1. RANITIDINE (ZANTAC)  2. TAKE A ZANTAC Wednesday NIGHT BEFORE BED  3.  4.  5.  6.  ____Fleets enema or Magnesium Citrate as directed.   _x___ Use CHG Soap or sage wipes as directed on instruction sheet   ____ Use inhalers on the day of surgery and bring to hospital day of surgery  ____ Stop Metformin and Janumet 2 days prior to surgery.    ____ Take 1/2 of usual insulin dose the night before surgery and none on the morning surgery.   ____ Follow recommendations from Cardiologist, Pulmonologist or PCP regarding stopping Aspirin, Coumadin, Plavix ,Eliquis, Effient, or Pradaxa, and Pletal.  X____Stop Anti-inflammatories such as Advil, Aleve, IBUPROFEN, Motrin, Naproxen, Naprosyn, Goodies powders or aspirin products. OK to take Tylenol   ____ Stop supplements until after surgery.    ____ Bring C-Pap to the hospital.

## 2017-03-03 ENCOUNTER — Encounter
Admission: RE | Admit: 2017-03-03 | Discharge: 2017-03-03 | Disposition: A | Discharge status: Home / Self Care | Payer: 59 | Source: Ambulatory Visit | Attending: Obstetrics and Gynecology | Admitting: Obstetrics and Gynecology

## 2017-03-03 DIAGNOSIS — Z882 Allergy status to sulfonamides status: Secondary | ICD-10-CM | POA: Diagnosis not present

## 2017-03-03 DIAGNOSIS — Z79899 Other long term (current) drug therapy: Secondary | ICD-10-CM | POA: Diagnosis not present

## 2017-03-03 DIAGNOSIS — N83201 Unspecified ovarian cyst, right side: Secondary | ICD-10-CM | POA: Diagnosis not present

## 2017-03-03 DIAGNOSIS — Z791 Long term (current) use of non-steroidal anti-inflammatories (NSAID): Secondary | ICD-10-CM | POA: Diagnosis not present

## 2017-03-03 DIAGNOSIS — N808 Other endometriosis: Secondary | ICD-10-CM | POA: Diagnosis not present

## 2017-03-03 DIAGNOSIS — R102 Pelvic and perineal pain: Secondary | ICD-10-CM | POA: Diagnosis present

## 2017-03-03 DIAGNOSIS — Z01810 Encounter for preprocedural cardiovascular examination: Secondary | ICD-10-CM | POA: Insufficient documentation

## 2017-03-03 DIAGNOSIS — Z01812 Encounter for preprocedural laboratory examination: Secondary | ICD-10-CM | POA: Insufficient documentation

## 2017-03-03 DIAGNOSIS — M1990 Unspecified osteoarthritis, unspecified site: Secondary | ICD-10-CM | POA: Diagnosis not present

## 2017-03-03 DIAGNOSIS — G8929 Other chronic pain: Secondary | ICD-10-CM | POA: Diagnosis not present

## 2017-03-03 DIAGNOSIS — E559 Vitamin D deficiency, unspecified: Secondary | ICD-10-CM | POA: Diagnosis not present

## 2017-03-03 DIAGNOSIS — N736 Female pelvic peritoneal adhesions (postinfective): Secondary | ICD-10-CM | POA: Diagnosis not present

## 2017-03-03 DIAGNOSIS — Z888 Allergy status to other drugs, medicaments and biological substances status: Secondary | ICD-10-CM | POA: Diagnosis not present

## 2017-03-03 DIAGNOSIS — I10 Essential (primary) hypertension: Secondary | ICD-10-CM | POA: Diagnosis not present

## 2017-03-03 DIAGNOSIS — M069 Rheumatoid arthritis, unspecified: Secondary | ICD-10-CM | POA: Diagnosis not present

## 2017-03-03 DIAGNOSIS — Z880 Allergy status to penicillin: Secondary | ICD-10-CM | POA: Diagnosis not present

## 2017-03-03 DIAGNOSIS — E785 Hyperlipidemia, unspecified: Secondary | ICD-10-CM | POA: Diagnosis not present

## 2017-03-03 DIAGNOSIS — N7011 Chronic salpingitis: Secondary | ICD-10-CM | POA: Diagnosis not present

## 2017-03-03 LAB — TYPE AND SCREEN
ABO/RH(D): O POS
Antibody Screen: NEGATIVE

## 2017-03-05 ENCOUNTER — Encounter: Payer: Self-pay | Admitting: *Deleted

## 2017-03-06 ENCOUNTER — Ambulatory Visit: Payer: 59 | Admitting: Anesthesiology

## 2017-03-06 ENCOUNTER — Encounter
Admission: RE | Disposition: A | Discharge status: Home / Self Care | Payer: Self-pay | Source: Ambulatory Visit | Attending: Obstetrics and Gynecology

## 2017-03-06 ENCOUNTER — Ambulatory Visit
Admission: RE | Admit: 2017-03-06 | Discharge: 2017-03-06 | Disposition: A | Discharge status: Home / Self Care | Payer: 59 | Source: Ambulatory Visit | Attending: Obstetrics and Gynecology | Admitting: Obstetrics and Gynecology

## 2017-03-06 DIAGNOSIS — R102 Pelvic and perineal pain: Secondary | ICD-10-CM | POA: Insufficient documentation

## 2017-03-06 DIAGNOSIS — Z791 Long term (current) use of non-steroidal anti-inflammatories (NSAID): Secondary | ICD-10-CM | POA: Insufficient documentation

## 2017-03-06 DIAGNOSIS — N7011 Chronic salpingitis: Secondary | ICD-10-CM | POA: Diagnosis not present

## 2017-03-06 DIAGNOSIS — M1990 Unspecified osteoarthritis, unspecified site: Secondary | ICD-10-CM | POA: Insufficient documentation

## 2017-03-06 DIAGNOSIS — E785 Hyperlipidemia, unspecified: Secondary | ICD-10-CM | POA: Insufficient documentation

## 2017-03-06 DIAGNOSIS — N83201 Unspecified ovarian cyst, right side: Secondary | ICD-10-CM | POA: Insufficient documentation

## 2017-03-06 DIAGNOSIS — N946 Dysmenorrhea, unspecified: Secondary | ICD-10-CM | POA: Diagnosis present

## 2017-03-06 DIAGNOSIS — M069 Rheumatoid arthritis, unspecified: Secondary | ICD-10-CM | POA: Insufficient documentation

## 2017-03-06 DIAGNOSIS — N809 Endometriosis, unspecified: Secondary | ICD-10-CM | POA: Diagnosis present

## 2017-03-06 DIAGNOSIS — N808 Other endometriosis: Secondary | ICD-10-CM | POA: Insufficient documentation

## 2017-03-06 DIAGNOSIS — I10 Essential (primary) hypertension: Secondary | ICD-10-CM | POA: Insufficient documentation

## 2017-03-06 DIAGNOSIS — N80129 Deep endometriosis of ovary, unspecified ovary: Secondary | ICD-10-CM | POA: Diagnosis present

## 2017-03-06 DIAGNOSIS — Z882 Allergy status to sulfonamides status: Secondary | ICD-10-CM | POA: Insufficient documentation

## 2017-03-06 DIAGNOSIS — N736 Female pelvic peritoneal adhesions (postinfective): Secondary | ICD-10-CM | POA: Insufficient documentation

## 2017-03-06 DIAGNOSIS — Z880 Allergy status to penicillin: Secondary | ICD-10-CM | POA: Insufficient documentation

## 2017-03-06 DIAGNOSIS — Z888 Allergy status to other drugs, medicaments and biological substances status: Secondary | ICD-10-CM | POA: Insufficient documentation

## 2017-03-06 DIAGNOSIS — E559 Vitamin D deficiency, unspecified: Secondary | ICD-10-CM | POA: Insufficient documentation

## 2017-03-06 DIAGNOSIS — Z79899 Other long term (current) drug therapy: Secondary | ICD-10-CM | POA: Insufficient documentation

## 2017-03-06 DIAGNOSIS — G8929 Other chronic pain: Secondary | ICD-10-CM | POA: Insufficient documentation

## 2017-03-06 HISTORY — PX: LAPAROSCOPY: SHX197

## 2017-03-06 HISTORY — PX: LAPAROSCOPIC BILATERAL SALPINGECTOMY: SHX5889

## 2017-03-06 HISTORY — PX: LAPAROSCOPIC OVARIAN CYSTECTOMY: SHX6248

## 2017-03-06 LAB — POCT PREGNANCY, URINE: Preg Test, Ur: NEGATIVE

## 2017-03-06 SURGERY — LAPAROSCOPY OPERATIVE
Anesthesia: General | Laterality: Right

## 2017-03-06 MED ORDER — HYDROMORPHONE HCL 1 MG/ML IJ SOLN
INTRAMUSCULAR | Status: AC
  Filled 2017-03-06: qty 1

## 2017-03-06 MED ORDER — OXYCODONE HCL 5 MG/5ML PO SOLN: 5.0000 mg | Freq: Once | ORAL | Status: DC | PRN

## 2017-03-06 MED ORDER — ONDANSETRON HCL 4 MG/2ML IJ SOLN
INTRAMUSCULAR | Status: AC
  Filled 2017-03-06: qty 2

## 2017-03-06 MED ORDER — HYDROMORPHONE HCL 1 MG/ML IJ SOLN
INTRAMUSCULAR | Status: DC | PRN
  Administered 2017-03-06: 0.5 mg via INTRAVENOUS

## 2017-03-06 MED ORDER — IBUPROFEN 600 MG PO TABS: 600.0000 mg | ORAL_TABLET | Freq: Four times a day (QID) | ORAL | 0 refills | Status: DC | PRN

## 2017-03-06 MED ORDER — FENTANYL CITRATE (PF) 100 MCG/2ML IJ SOLN
INTRAMUSCULAR | Status: AC
  Administered 2017-03-06: 25 ug via INTRAVENOUS
  Filled 2017-03-06: qty 2

## 2017-03-06 MED ORDER — LACTATED RINGERS IV SOLN
INTRAVENOUS | Status: DC | PRN
  Administered 2017-03-06: 09:00:00 via INTRAVENOUS

## 2017-03-06 MED ORDER — LACTATED RINGERS IV SOLN
INTRAVENOUS | Status: DC
  Administered 2017-03-06: 08:00:00 via INTRAVENOUS

## 2017-03-06 MED ORDER — ACETAMINOPHEN 10 MG/ML IV SOLN
INTRAVENOUS | Status: AC
  Filled 2017-03-06: qty 100

## 2017-03-06 MED ORDER — PROPOFOL 10 MG/ML IV BOLUS
INTRAVENOUS | Status: AC
  Filled 2017-03-06: qty 20

## 2017-03-06 MED ORDER — PROMETHAZINE HCL 25 MG/ML IJ SOLN
INTRAMUSCULAR | Status: AC
  Filled 2017-03-06: qty 1

## 2017-03-06 MED ORDER — OXYCODONE-ACETAMINOPHEN 5-325 MG PO TABS: 1.0000 | ORAL_TABLET | Freq: Four times a day (QID) | ORAL | 0 refills | Status: DC | PRN

## 2017-03-06 MED ORDER — SODIUM CHLORIDE 0.9 % IJ SOLN
INTRAMUSCULAR | Status: AC
  Filled 2017-03-06: qty 10

## 2017-03-06 MED ORDER — FENTANYL CITRATE (PF) 250 MCG/5ML IJ SOLN
INTRAMUSCULAR | Status: AC
  Filled 2017-03-06: qty 5

## 2017-03-06 MED ORDER — BUPIVACAINE HCL (PF) 0.5 % IJ SOLN
INTRAMUSCULAR | Status: AC
Start: 2017-03-06 — End: 2017-03-06
  Filled 2017-03-06: qty 30

## 2017-03-06 MED ORDER — DEXMEDETOMIDINE HCL IN NACL 80 MCG/20ML IV SOLN
INTRAVENOUS | Status: AC
  Filled 2017-03-06: qty 20

## 2017-03-06 MED ORDER — LACTATED RINGERS IV SOLN: INTRAVENOUS | Status: DC

## 2017-03-06 MED ORDER — FENTANYL CITRATE (PF) 100 MCG/2ML IJ SOLN
INTRAMUSCULAR | Status: DC | PRN
  Administered 2017-03-06: 100 ug via INTRAVENOUS
  Administered 2017-03-06: 50 ug via INTRAVENOUS

## 2017-03-06 MED ORDER — DEXAMETHASONE SODIUM PHOSPHATE 10 MG/ML IJ SOLN
INTRAMUSCULAR | Status: DC | PRN
  Administered 2017-03-06: 10 mg via INTRAVENOUS

## 2017-03-06 MED ORDER — ROCURONIUM BROMIDE 100 MG/10ML IV SOLN
INTRAVENOUS | Status: DC | PRN
  Administered 2017-03-06: 50 mg via INTRAVENOUS

## 2017-03-06 MED ORDER — ACETAMINOPHEN 10 MG/ML IV SOLN
INTRAVENOUS | Status: DC | PRN
  Administered 2017-03-06: 1000 mg via INTRAVENOUS

## 2017-03-06 MED ORDER — PROMETHAZINE HCL 25 MG/ML IJ SOLN
6.2500 mg | INTRAMUSCULAR | Status: DC | PRN
Start: 2017-03-06 — End: 2017-03-06
  Administered 2017-03-06: 12.5 mg via INTRAVENOUS

## 2017-03-06 MED ORDER — BUPIVACAINE HCL 0.5 % IJ SOLN
INTRAMUSCULAR | Status: DC | PRN
  Administered 2017-03-06: 17 mL

## 2017-03-06 MED ORDER — FENTANYL CITRATE (PF) 100 MCG/2ML IJ SOLN
25.0000 ug | INTRAMUSCULAR | Status: DC | PRN
  Administered 2017-03-06 (×2): 25 ug via INTRAVENOUS

## 2017-03-06 MED ORDER — OXYCODONE HCL 5 MG PO TABS: 5.0000 mg | ORAL_TABLET | Freq: Once | ORAL | Status: DC | PRN

## 2017-03-06 MED ORDER — MIDAZOLAM HCL 2 MG/2ML IJ SOLN
INTRAMUSCULAR | Status: DC | PRN
  Administered 2017-03-06: 2 mg via INTRAVENOUS

## 2017-03-06 MED ORDER — MIDAZOLAM HCL 2 MG/2ML IJ SOLN
INTRAMUSCULAR | Status: AC
  Filled 2017-03-06: qty 2

## 2017-03-06 MED ORDER — PROPOFOL 10 MG/ML IV BOLUS
INTRAVENOUS | Status: DC | PRN
  Administered 2017-03-06: 160 mg via INTRAVENOUS

## 2017-03-06 MED ORDER — LIDOCAINE HCL (CARDIAC) 20 MG/ML IV SOLN
INTRAVENOUS | Status: DC | PRN
  Administered 2017-03-06: 100 mg via INTRAVENOUS

## 2017-03-06 SURGICAL SUPPLY — 45 items
ANCHOR TIS RET SYS 235ML (MISCELLANEOUS) ×4 IMPLANT
BAG URINE DRAINAGE (UROLOGICAL SUPPLIES) ×4 IMPLANT
BLADE SURG SZ11 CARB STEEL (BLADE) ×4 IMPLANT
CANISTER SUCT 1200ML W/VALVE (MISCELLANEOUS) ×4 IMPLANT
CATH FOLEY 2WAY  5CC 16FR (CATHETERS) ×2
CATH URTH 16FR FL 2W BLN LF (CATHETERS) ×2 IMPLANT
CHLORAPREP W/TINT 26ML (MISCELLANEOUS) ×4 IMPLANT
DERMABOND ADVANCED (GAUZE/BANDAGES/DRESSINGS) ×2
DERMABOND ADVANCED .7 DNX12 (GAUZE/BANDAGES/DRESSINGS) ×2 IMPLANT
DRAPE LEGGINS SURG 28X43 STRL (DRAPES) ×4 IMPLANT
DRAPE SHEET LG 3/4 BI-LAMINATE (DRAPES) ×4 IMPLANT
DRAPE UNDER BUTTOCK W/FLU (DRAPES) ×4 IMPLANT
ELECT REM PT RETURN 9FT ADLT (ELECTROSURGICAL) ×4
ELECTRODE REM PT RTRN 9FT ADLT (ELECTROSURGICAL) ×2 IMPLANT
GLOVE BIO SURGEON STRL SZ7 (GLOVE) ×8 IMPLANT
GLOVE BIOGEL PI IND STRL 7.5 (GLOVE) ×2 IMPLANT
GLOVE BIOGEL PI INDICATOR 7.5 (GLOVE) ×2
GOWN STRL REUS W/ TWL LRG LVL3 (GOWN DISPOSABLE) ×4 IMPLANT
GOWN STRL REUS W/TWL LRG LVL3 (GOWN DISPOSABLE) ×4
IRRIGATION STRYKERFLOW (MISCELLANEOUS) ×2 IMPLANT
IRRIGATOR STRYKERFLOW (MISCELLANEOUS) ×4
IV LACTATED RINGERS 1000ML (IV SOLUTION) ×4 IMPLANT
KIT PINK PAD W/HEAD ARE REST (MISCELLANEOUS) ×4
KIT PINK PAD W/HEAD ARM REST (MISCELLANEOUS) ×2 IMPLANT
KIT RM TURNOVER CYSTO AR (KITS) ×4 IMPLANT
LABEL OR SOLS (LABEL) ×4 IMPLANT
NEEDLE HYPO 22GX1.5 SAFETY (NEEDLE) ×4 IMPLANT
NS IRRIG 500ML POUR BTL (IV SOLUTION) ×4 IMPLANT
PACK LAP CHOLECYSTECTOMY (MISCELLANEOUS) ×4 IMPLANT
PAD OB MATERNITY 4.3X12.25 (PERSONAL CARE ITEMS) ×4 IMPLANT
PAD PREP 24X41 OB/GYN DISP (PERSONAL CARE ITEMS) ×4 IMPLANT
SCISSORS METZENBAUM CVD 33 (INSTRUMENTS) IMPLANT
SHEARS HARMONIC ACE PLUS 36CM (ENDOMECHANICALS) ×4 IMPLANT
SLEEVE ENDOPATH XCEL 5M (ENDOMECHANICALS) ×4 IMPLANT
SOL PREP PVP 2OZ (MISCELLANEOUS) ×4
SOLUTION PREP PVP 2OZ (MISCELLANEOUS) ×2 IMPLANT
SURGILUBE 2OZ TUBE FLIPTOP (MISCELLANEOUS) ×4 IMPLANT
SUT MNCRL 4-0 (SUTURE)
SUT MNCRL 4-0 27XMFL (SUTURE)
SUT VIC AB 2-0 UR6 27 (SUTURE) IMPLANT
SUTURE MNCRL 4-0 27XMF (SUTURE) IMPLANT
TROCAR ENDO BLADELESS 11MM (ENDOMECHANICALS) ×4 IMPLANT
TROCAR XCEL NON-BLD 5MMX100MML (ENDOMECHANICALS) ×4 IMPLANT
TROCAR XCEL UNIV SLVE 11M 100M (ENDOMECHANICALS) IMPLANT
TUBING INSUFFLATION (TUBING) ×4 IMPLANT

## 2017-03-06 NOTE — Discharge Instructions (Signed)

## 2017-03-06 NOTE — OR Nursing (Signed)
Phenergan given to pt on arrival to postop by PACU RN for nausea/vomiting

## 2017-03-06 NOTE — OR Nursing (Signed)
Awake from nap after phenergan admin, denies nausea, taking few sips and ice chips

## 2017-03-06 NOTE — Anesthesia Postprocedure Evaluation (Signed)
Anesthesia Post Note  Patient: Angelica Banks  Procedure(s) Performed: LAPAROSCOPY OPERATIVE (N/A ) LAPAROSCOPIC OVARIAN CYSTECTOMY (Right ) LAPAROSCOPIC  SALPINGECTOMY WITH REMOVAL OF ANY ABNORMAL TISSUE NOTED (Right )  Patient location during evaluation: PACU Anesthesia Type: General Level of consciousness: awake and alert Pain management: pain level controlled Vital Signs Assessment: post-procedure vital signs reviewed and stable Respiratory status: spontaneous breathing, nonlabored ventilation, respiratory function stable and patient connected to nasal cannula oxygen Cardiovascular status: blood pressure returned to baseline and stable Postop Assessment: no apparent nausea or vomiting Anesthetic complications: no     Last Vitals:  Vitals:   03/06/17 1147 03/06/17 1335  BP: 130/78 125/79  Pulse: 74 72  Resp: 14 16  Temp: 36.5 C 36.8 C  SpO2: 97% 100%    Last Pain:  Vitals:   03/06/17 1335  TempSrc: Temporal  PainSc:                  Precious Haws Greenley Martone

## 2017-03-06 NOTE — Anesthesia Procedure Notes (Signed)
Procedure Name: Intubation Date/Time: 03/06/2017 8:41 AM Performed by: Justus Memory, CRNA Pre-anesthesia Checklist: Patient identified, Patient being monitored, Timeout performed, Emergency Drugs available and Suction available Patient Re-evaluated:Patient Re-evaluated prior to induction Oxygen Delivery Method: Circle system utilized Preoxygenation: Pre-oxygenation with 100% oxygen Induction Type: IV induction Ventilation: Mask ventilation without difficulty Laryngoscope Size: 3 and McGraph (elective use) Grade View: Grade I Tube type: Oral Tube size: 7.0 mm Number of attempts: 1 Airway Equipment and Method: Stylet Placement Confirmation: ETT inserted through vocal cords under direct vision,  positive ETCO2 and breath sounds checked- equal and bilateral Secured at: 21 cm Tube secured with: Tape Dental Injury: Teeth and Oropharynx as per pre-operative assessment

## 2017-03-06 NOTE — OR Nursing (Signed)
Dr. Glennon Mac in to see pt & family in postop 1236 pm

## 2017-03-06 NOTE — Anesthesia Post-op Follow-up Note (Signed)
Anesthesia QCDR form completed.        

## 2017-03-06 NOTE — Op Note (Signed)
Operative Note    Pre-Op Diagnosis:  1) chronic pelvic pain 2) bilateral hydrosalpinges 3) bilateral endometriomas  Post-Op Diagnosis:  1) chronic pelvic pain 2) bilateral hydrosalpinges 3) Large serous right ovarian cyst 4) endometriosis  Procedures:  1. Operative laparoscopy 2. Right salpingectomy 3. Right ovarian cystectomy 4. Extensive lysis of adhesions  Primary Surgeon: Prentice Docker, MD   Assistant Surgeon: Adrian Prows, MD  EBL: 25 mL   IVF: 1,200 mL   Urine output: 200 mL clear urine at end of procedure  Specimens:  1) right fallopian tube 2) right ovarian cyst wall and right para-ovarian cyst wall  Drains: none  Complications: None   Disposition: PACU   Condition: Stable   Findings:  1) right ovarian cyst with serous contents 2) right hydrosalpinx 3) dense adhesions of sigmoid colon to left pelvic sidewall, left fallopian tube, and left ovary. Unable to visualize left ovary due to adhesions. 4) multiple scattered endometriosis implants 5) partially obliterated pelvic cul-de-sac  Procedure Summary:  The patient was taken to the operating room where general anesthesia was administered and found to be adequate. She was placed in the dorsal supine lithotomy position in [place] stirrups and prepped and draped in usual sterile fashion. After a timeout was called an indwelling catheter was placed in her bladder. A sterile speculum was placed in the vagina and a single-tooth tenaculum was used to grasp the anterior lip of the cervix. An acorn uterine manipulator was affixed to the tenaculum. The speculum was removed from the vagina.  Attention was turned to the abdomen where after injection of local anesthetic, a 5 mm infraumbilical incision was made with the scalpel. Entry into the abdomen was obtained via Optiview trocar technique (a blunt entry technique with camera visualization through the obturator upon entry). Verification of entry into the  abdomen was obtained using opening pressures. The abdomen was insufflated with CO2. The camera was introduced through the trocar with verification of atraumatic entry.  A left lower quadrant 5 mm and right lower quadrant 11 mm port site were created under direct, intra-abdominal camera visualization without difficulty.  A survey of the pelvis was taken with the above-noted findings.  The there was small bowel adherent to the right pelvic brim, which was reduced without difficulty. This allowed improved exposure to the right adnexa.  The right fallopian tube and ovary were densely adherent to the right pelvic sidewall. However, these adhesions were carefully taken down and the right fallopian tube was freed and electrocautery-ligated along the mesosalpinx and removed in its entirety.  The right ovarian cyst was attempted to be removed, but was punctured in the process.  The contents were clear fluid that was yellow-brown in color and thin.  This fluid was evacuated.  The cyst wall was removed and hemostasis was noted.  All right pelvic endometriosis implants not overlying bowel or ureter were carefully fulgurated.  Attention was turned to the left pelvis.  The left fallopian tube was noted to be curved posteriorly and medially with the mesosalpinx and sigmoid colon being very densely adherent to the fallopian tube.  Attempt was made to free these adhesions, however high risk of damaging the sigmoid was encountered.  Attempts were made to go lateral to the adnexa and find the ovary were made, however dense adhesion precluded this approach.  The left adnexa was also adherent to the posterior cul-de-sac and could not be freed.  Consideration of retroperitoneal approach was made. However, given the bowel adhesions, this was also not  safely possible.  At this point, with high risk of bowel injury with further attempt at lysis of adhesions, the decision was made to abort this portion of the surgery.   Inspection of the  pelvis was taken and hemostasis was noted.  The abdomen was desufflated of CO2.  The troacars were removed without difficulty.  All port sites were closed with surgical skin glue and suture.    The tenaculum and uterine manipulator were removed from the cervix with hemostasis noted. The foley catheter was removed, as well.  The vagina was inspected and found to be free of sponges, needles, and instruments.   The patient tolerated the procedure well.  Sponge, lap, needle, and instrument counts were correct x 2.  VTE prophylaxis: SCDs. Antibiotic prophylaxis: none indicated nor given. She was awakened in the operating room and was taken to the PACU in stable condition.   Prentice Docker, MD 03/06/2017 10:31 AM

## 2017-03-06 NOTE — Transfer of Care (Signed)
Immediate Anesthesia Transfer of Care Note  Patient: Angelica Banks  Procedure(s) Performed: LAPAROSCOPY OPERATIVE (N/A ) LAPAROSCOPIC OVARIAN CYSTECTOMY (Right ) LAPAROSCOPIC  SALPINGECTOMY WITH REMOVAL OF ANY ABNORMAL TISSUE NOTED (Right )  Patient Location: PACU  Anesthesia Type:General  Level of Consciousness: sedated  Airway & Oxygen Therapy: Patient Spontanous Breathing and Patient connected to face mask oxygen  Post-op Assessment: Report given to RN and Post -op Vital signs reviewed and stable  Post vital signs: Reviewed and stable  Last Vitals:  Vitals:   03/06/17 0729 03/06/17 1046  BP: (!) 145/97 126/84  Pulse: 82 81  Resp: 18 (!) 22  Temp: 36.4 C 36.5 C  SpO2: 98% 97%    Last Pain:  Vitals:   03/06/17 0729  TempSrc: Tympanic         Complications: No apparent anesthesia complications

## 2017-03-06 NOTE — Interval H&P Note (Signed)
History and Physical Interval Note:  03/06/2017 7:29 AM  Angelica Banks  has presented today for surgery, with the diagnosis of endometrioma,bilateral hydrosalpinges,chronic pelvic pain  The various methods of treatment have been discussed with the patient and family. After consideration of risks, benefits and other options for treatment, the patient has consented to  Procedure(s): LAPAROSCOPY OPERATIVE (N/A) LAPAROSCOPIC OVARIAN CYSTECTOMY (Bilateral) LAPAROSCOPIC BILATERAL SALPINGECTOMY WITH REMOVAL OF ANY ABNORMAL TISSUE NOTED (Bilateral) as a surgical intervention .  [name] history has been reviewed, patient examined, no change in status, stable for surgery.  I have reviewed the patient's chart and labs.  Questions were answered to the patient's satisfaction.  The consent has been reviewed. The patient agrees to proceed understanding that removal of her fallopian tubes will render her incapable of becoming pregnant naturally.  She voiced that she continues to no longer desire to become pregnant.   Prentice Docker, MD 03/06/2017 7:30 AM

## 2017-03-06 NOTE — Anesthesia Preprocedure Evaluation (Signed)
Anesthesia Evaluation  Patient identified by MRN, date of birth, ID band Patient awake    Reviewed: Allergy & Precautions, H&P , NPO status , Patient's Chart, lab work & pertinent test results  History of Anesthesia Complications (+) PONV and history of anesthetic complications  Airway Mallampati: III  TM Distance: >3 FB Neck ROM: full    Dental  (+) Chipped   Pulmonary neg shortness of breath, asthma ,           Cardiovascular Exercise Tolerance: Good hypertension, (-) angina(-) Past MI and (-) DOE      Neuro/Psych  Headaches, PSYCHIATRIC DISORDERS    GI/Hepatic Neg liver ROS, GERD  Medicated and Controlled,  Endo/Other  negative endocrine ROS  Renal/GU      Musculoskeletal  (+) Arthritis ,   Abdominal   Peds  Hematology negative hematology ROS (+)   Anesthesia Other Findings Past Medical History: No date: ADD (attention deficit disorder) No date: Anemia     Comment:  iron deficiency anemia-LAST HGB 11.8 ON 03-2016 No date: Arthritis     Comment:  Rheumatoid arthritis No date: Asthma     Comment:  well-controlled-no inhalers No date: GERD (gastroesophageal reflux disease)     Comment:  occ- rolaids No date: Headache 04/16/2012: History of Papanicolaou smear of cervix     Comment:  -/- No date: Hyperlipemia No date: Hypertension No date: Pre-eclampsia No date: Preeclampsia No date: Vitamin D deficiency  Past Surgical History: 03/27/2016: DILATATION & CURETTAGE/HYSTEROSCOPY WITH MYOSURE; N/A     Comment:  Procedure: DILATATION & CURETTAGE/HYSTEROSCOPY WITH               MYOSURE, MYOMECTOMY;  Surgeon: Gae Dry, MD;                Location: [facility] ORS;  Service: Gynecology;  Laterality:               N/A; 03/09/2015: [place]; N/A     Comment:  Procedure: DILATATION & CURETTAGE/HYSTEROSCOPY WITH               NOVASURE ABLATION;  Surgeon: Gae Dry, MD;                Location: [facility] ORS;  Service: Gynecology;  Laterality:               N/A;  BMI    Body Mass Index:  30.21 kg/m      Reproductive/Obstetrics negative OB ROS                             Anesthesia Physical Anesthesia Plan  ASA: III  Anesthesia Plan: General ETT   Post-op Pain Management:    Induction: Intravenous  PONV Risk Score and Plan: 4 or greater and Ondansetron, Dexamethasone and Midazolam  Airway Management Planned: Oral ETT  Additional Equipment:   Intra-op Plan:   Post-operative Plan: Extubation in OR  Informed Consent: I have reviewed the patients History and Physical, chart, labs and discussed the procedure including the risks, benefits and alternatives for the proposed anesthesia with the patient or authorized representative who has indicated his/her understanding and acceptance.   Dental Advisory Given  Plan Discussed with: Anesthesiologist, CRNA and Surgeon  Anesthesia Plan Comments: (Patient consented for risks of anesthesia including but not limited to:  - adverse reactions to medications - damage to teeth, lips or other oral mucosa - sore  throat or hoarseness - Damage to heart, brain, lungs or loss of life  Patient voiced understanding.)        Anesthesia Quick Evaluation

## 2017-03-07 ENCOUNTER — Encounter: Payer: Self-pay | Admitting: Obstetrics and Gynecology

## 2017-03-10 LAB — SURGICAL PATHOLOGY

## 2017-03-12 ENCOUNTER — Ambulatory Visit (INDEPENDENT_AMBULATORY_CARE_PROVIDER_SITE_OTHER): Payer: 59 | Admitting: Obstetrics and Gynecology

## 2017-03-12 ENCOUNTER — Encounter: Payer: Self-pay | Admitting: Obstetrics and Gynecology

## 2017-03-12 VITALS — BP 124/78 | Wt 176.0 lb

## 2017-03-12 DIAGNOSIS — N80129 Deep endometriosis of ovary, unspecified ovary: Secondary | ICD-10-CM

## 2017-03-12 DIAGNOSIS — N809 Endometriosis, unspecified: Secondary | ICD-10-CM

## 2017-03-12 DIAGNOSIS — R102 Pelvic and perineal pain unspecified side: Secondary | ICD-10-CM

## 2017-03-12 DIAGNOSIS — G8929 Other chronic pain: Secondary | ICD-10-CM

## 2017-03-12 NOTE — Progress Notes (Signed)
   Postoperative Follow-up Patient presents post op from operative laparoscopy, right salpingectomy, right ovarian cystectomy (abandoned left salpingectomy, left ovarian cystectomy)  6days ago for pelvic pain.  Subjective: Patient reports some improvement in her preop symptoms. Eating a regular diet without difficulty. The patient is not having any pain.  Activity: slowly increasing. Denies fevers, chills, nausea, emesis. She is voiding and having bowel movements normally.  Objective: Vitals:   03/12/17 1601  BP: 124/78   Vital Signs: BP 124/78   Wt 176 lb (79.8 kg)   LMP 02/14/2017 (Exact Date)   BMI 30.21 kg/m  Constitutional: Well nourished, well developed female in no acute distress.  HEENT: normal Skin: Warm and dry.  Extremity: no edema  Abdomen: soft, nontender, nondistended, +BS clean, dry, intact and all incsions without erythema, induration, warmth, and tenderness   Assessment: 36 y.o. s/p operative laparoscopy, right salpingectomy, right ovarian cystectomy progressing well  Plan: Patient has done well after surgery with no apparent complications.  I have discussed the post-operative course to date, and the expected progress moving forward.  The patient understands what complications to be concerned about.  I will see the patient in routine follow up, or sooner if needed.    Activity plan: increase slowly. Discussed surgery and limitations of surgery I performed. After much discussion will refer to [facility] for further evaluation and possible surgery.  Prentice Docker, MD 03/12/2017, 4:15 PM

## 2017-06-12 ENCOUNTER — Ambulatory Visit: Payer: 59 | Admitting: Obstetrics and Gynecology

## 2017-06-12 ENCOUNTER — Encounter: Payer: Self-pay | Admitting: Obstetrics and Gynecology

## 2017-06-12 VITALS — BP 180/90 | HR 60 | Ht 64.0 in | Wt 178.0 lb

## 2017-06-12 DIAGNOSIS — I10 Essential (primary) hypertension: Secondary | ICD-10-CM

## 2017-06-12 MED ORDER — LISINOPRIL 10 MG PO TABS: 10.0000 mg | ORAL_TABLET | Freq: Every day | ORAL | 0 refills | Status: DC

## 2017-06-12 NOTE — Patient Instructions (Signed)
I value your feedback and entrusting us with your care. If you get a Angelica Banks patient survey, I would appreciate you taking the time to let us know about your experience today. Thank you! 

## 2017-06-12 NOTE — Progress Notes (Signed)
Chief Complaint  Patient presents with  . Blood Pressure Check    HPI:      Angelica Banks is a 37 y.o. G1P1001 who LMP was Patient's last menstrual period was 05/23/2017., presents today for BP f/u. She has been seeing [facility] for upcoming surgery for endometriosis. Having hyst with colon resection 07/04/17. She was referred back to [place] for elevated BP measurements there. We are managing her HTN. She was on lisinopril a couple yrs ago with BP control and no side effects. Rx changed to methyldopa 500 mg BID due to trying to conceive. Pt no longer interested in conception last yr but wanted to continue same BP meds. No side effects. Supposed to be taking 500 mg dose BID but takes 250 mg in AM and 500 mg in PM due to daytime fatigue with 500 mg dose in AM.  She states she had normal CMP with Dr. Jefm Bryant a couple wks ago since methotrexate restarted for RA.   BP readings at [facility]:  04/15/17  204/120 (pt states she was very stressed) 05/08/17  140/78 05/28/17   149/101  Pt states readings at home are in the 140s/90s. She occas has headaches, but no chest pain, vision changes.    Past Medical History:  Diagnosis Date  . ADD (attention deficit disorder)   . Anemia    iron deficiency anemia-LAST HGB 11.8 ON 03-2016  . Arthritis    Rheumatoid arthritis  . Asthma    well-controlled-no inhalers  . GERD (gastroesophageal reflux disease)    occ- rolaids  . Headache   . History of Papanicolaou smear of cervix 04/16/2012   -/-  . Hyperlipemia   . Hypertension   . Pre-eclampsia   . Preeclampsia   . Vitamin D deficiency     Past Surgical History:  Procedure Laterality Date  . DILATATION & CURETTAGE/HYSTEROSCOPY WITH MYOSURE N/A 03/27/2016   Procedure: [place] WITH MYOSURE, MYOMECTOMY;  Surgeon: Gae Dry, MD;  Location: [facility] ORS;  Service: Gynecology;  Laterality: N/A;  . DILITATION & CURRETTAGE/HYSTROSCOPY WITH NOVASURE ABLATION N/A 03/09/2015   Procedure:  DILATATION & CURETTAGE/HYSTEROSCOPY WITH NOVASURE ABLATION;  Surgeon: Gae Dry, MD;  Location: [facility] ORS;  Service: Gynecology;  Laterality: N/A;  . LAPAROSCOPIC BILATERAL SALPINGECTOMY Right 03/06/2017   Procedure: LAPAROSCOPIC  SALPINGECTOMY WITH REMOVAL OF ANY ABNORMAL TISSUE NOTED;  Surgeon: Will Bonnet, MD;  Location: [facility] ORS;  Service: Gynecology;  Laterality: Right;  . LAPAROSCOPIC OVARIAN CYSTECTOMY Right 03/06/2017   Procedure: LAPAROSCOPIC OVARIAN CYSTECTOMY;  Surgeon: Will Bonnet, MD;  Location: [facility] ORS;  Service: Gynecology;  Laterality: Right;  . LAPAROSCOPY N/A 03/06/2017   Procedure: LAPAROSCOPY OPERATIVE;  Surgeon: Will Bonnet, MD;  Location: [facility] ORS;  Service: Gynecology;  Laterality: N/A;    Family History  Problem Relation Age of Onset  . Hypertension Mother   . Hypertension Father   . Hyperlipidemia Father   . Lymphoma Father 30       non hodgkins   . Brain cancer Paternal Uncle   . Lung cancer Maternal Grandmother   . Breast cancer Paternal Aunt 63    Social History   Socioeconomic History  . Marital status: Married    Spouse name: Not on file  . Number of children: 1  . Years of education: 92  . Highest education level: Not on file  Social Needs  . Financial resource strain: Not on file  . Food insecurity - worry: Not on file  .  Food insecurity - inability: Not on file  . Transportation needs - medical: Not on file  . Transportation needs - non-medical: Not on file  Occupational History  . Occupation: business    Comment: Surveyor, minerals  Tobacco Use  . Smoking status: Never Smoker  . Smokeless tobacco: Never Used  Substance and Sexual Activity  . Alcohol use: Yes    Comment: occ  . Drug use: No  . Sexual activity: Yes    Birth control/protection: None  Other Topics Concern  . Not on file  Social History Narrative  . Not on file     Current Outpatient Medications:  .  ibuprofen (ADVIL,MOTRIN) 600 MG tablet,  Take 1 tablet (600 mg total) by mouth every 6 (six) hours as needed for mild pain or cramping., Disp: 30 tablet, Rfl: 0 .  methyldopa (ALDOMET) 500 MG tablet, Take 1 tablet (500 mg total) by mouth 2 (two) times daily. (Patient taking differently: Take 500 mg every evening by mouth. ), Disp: 60 tablet, Rfl: 12 .  Multiple Vitamin (MULTIVITAMIN WITH MINERALS) TABS tablet, Take 1 tablet by mouth daily., Disp: , Rfl:  .  ORENCIA CLICKJECT 732 MG/ML SOAJ, INJECT ONE pen SUBCUTANEOUSLY every week-ON WEDENSDAYS-PT INSTRUCTED BY RHEUMATOLOGIST TO STOP MED 2 WEEKS PRIOR TO SURGERY AND HOLD FOR 2 WEEKS AFTER SURGERY, Disp: , Rfl: 6 .  ranitidine (ZANTAC) 150 MG tablet, Take 150 mg daily as needed by mouth for heartburn., Disp: , Rfl:  .  Cholecalciferol (VITAMIN D3 PO), Take 1 tablet by mouth daily., Disp: , Rfl:  .  lisinopril (ZESTRIL) 10 MG tablet, Take 1 tablet (10 mg total) by mouth daily., Disp: 30 tablet, Rfl: 0 .  oxyCODONE-acetaminophen (ROXICET) 5-325 MG tablet, Take 1 tablet by mouth every 6 (six) hours as needed for severe pain (breakthrough pain). (Patient not taking: Reported on 06/12/2017), Disp: 20 tablet, Rfl: 0   ROS:  Review of Systems  Constitutional: Negative for fatigue, fever and unexpected weight change.  Respiratory: Negative for cough, shortness of breath and wheezing.   Cardiovascular: Negative for chest pain, palpitations and leg swelling.  Gastrointestinal: Negative for blood in stool, constipation, diarrhea, nausea and vomiting.  Endocrine: Negative for cold intolerance, heat intolerance and polyuria.  Genitourinary: Negative for dyspareunia, dysuria, flank pain, frequency, genital sores, hematuria, menstrual problem, pelvic pain, urgency, vaginal bleeding, vaginal discharge and vaginal pain.  Musculoskeletal: Positive for arthralgias. Negative for back pain, joint swelling and myalgias.  Skin: Negative for rash.  Neurological: Positive for headaches. Negative for dizziness,  syncope, light-headedness and numbness.  Hematological: Negative for adenopathy.  Psychiatric/Behavioral: Negative for agitation, confusion, sleep disturbance and suicidal ideas. The patient is not nervous/anxious.      OBJECTIVE:   Vitals:  BP (!) 180/90   Pulse 60   Ht 5\' 4"  (1.626 m)   Wt 178 lb (80.7 kg)   LMP 05/23/2017   BMI 30.55 kg/m   Physical Exam  Constitutional: She is oriented to person, place, and time and well-developed, well-nourished, and in no distress.  Neck: Normal range of motion.  Pulmonary/Chest: Effort normal.  Musculoskeletal: Normal range of motion.  Neurological: She is alert and oriented to person, place, and time.  Psychiatric: Memory, affect and judgment normal.  Vitals reviewed.  Assessment/Plan: Essential hypertension - Plan: lisinopril (ZESTRIL) 10 MG tablet  Not controlled with methyldopa. Change back to lisinopril 10 mg daily. D/C AM dose of methyldopa and wean off PM dose over next wk. Keep BP chk at home.  RTO in 11 days for f/u/sooner prn.   Meds ordered this encounter  Medications  . lisinopril (ZESTRIL) 10 MG tablet    Sig: Take 1 tablet (10 mg total) by mouth daily.    Dispense:  30 tablet    Refill:  0    Order Specific Question:   Supervising Provider    Answer:   Gae Dry [[id number]]      Return in 11 days (on 06/23/2017) for BP f/u.  Nathanel Tallman B. Delyla Sandeen, PA-C 06/12/2017 12:10 PM

## 2017-06-23 ENCOUNTER — Ambulatory Visit (INDEPENDENT_AMBULATORY_CARE_PROVIDER_SITE_OTHER): Payer: 59 | Admitting: Obstetrics and Gynecology

## 2017-06-23 ENCOUNTER — Encounter: Payer: Self-pay | Admitting: Obstetrics and Gynecology

## 2017-06-23 VITALS — BP 150/90 | HR 89 | Ht 64.0 in | Wt 175.0 lb

## 2017-06-23 DIAGNOSIS — I10 Essential (primary) hypertension: Secondary | ICD-10-CM

## 2017-06-23 NOTE — Progress Notes (Signed)
Patient, No Pcp Per   Chief Complaint  Patient presents with  . Follow-up    BP f/u    HPI:      [name] is a 37 y.o. G1P1001 who LMP was Patient's last menstrual period was 06/18/2017 (exact date)., presents today for 10 day BP f/u. Pt weaned off methyldopa and started lisinopril 10 mg 06/12/17. Pt noted dizziness day 3 of methyldopa so stopped it. Doing fine with lisinopril. No side effects/ACEI cough. Has not taken home Bps since I last saw her. Had BP control in past with current dose. No headaches/SOB/chest pain. Has anxiety about upcoming colon resection 07/04/17.   06/12/17 BP=180/90  06/12/17 NOTE: She was referred back to [place] for elevated BP measurements there. We are managing her HTN. She was on lisinopril a couple yrs ago with BP control and no side effects. Rx changed to methyldopa 500 mg BID due to trying to conceive. Pt no longer interested in conception last yr but wanted to continue same BP meds. No side effects. Supposed to be taking 500 mg dose BID but takes 250 mg in AM and 500 mg in PM due to daytime fatigue with 500 mg dose in AM.  She states she had normal CMP with Dr. Jefm Bryant a couple wks ago since methotrexate restarted for RA.   BP readings at [facility]:  04/15/17  204/120 (pt states she was very stressed) 05/08/17  140/78 05/28/17   149/101  Pt states readings at home are in the 140s/90s. She occas has headaches, but no chest pain, vision changes.    Past Medical History:  Diagnosis Date  . ADD (attention deficit disorder)   . Anemia    iron deficiency anemia-LAST HGB 11.8 ON 03-2016  . Arthritis    Rheumatoid arthritis  . Asthma    well-controlled-no inhalers  . GERD (gastroesophageal reflux disease)    occ- rolaids  . Headache   . History of Papanicolaou smear of cervix 04/16/2012   -/-  . Hyperlipemia   . Hypertension   . Pre-eclampsia   . Preeclampsia   . Vitamin D deficiency     Past Surgical History:  Procedure Laterality Date  .  DILATATION & CURETTAGE/HYSTEROSCOPY WITH MYOSURE N/A 03/27/2016   Procedure: [place] WITH MYOSURE, MYOMECTOMY;  Surgeon: Gae Dry, MD;  Location: [facility] ORS;  Service: Gynecology;  Laterality: N/A;  . DILITATION & CURRETTAGE/HYSTROSCOPY WITH NOVASURE ABLATION N/A 03/09/2015   Procedure: DILATATION & CURETTAGE/HYSTEROSCOPY WITH NOVASURE ABLATION;  Surgeon: Gae Dry, MD;  Location: [facility] ORS;  Service: Gynecology;  Laterality: N/A;  . LAPAROSCOPIC BILATERAL SALPINGECTOMY Right 03/06/2017   Procedure: LAPAROSCOPIC  SALPINGECTOMY WITH REMOVAL OF ANY ABNORMAL TISSUE NOTED;  Surgeon: Will Bonnet, MD;  Location: [facility] ORS;  Service: Gynecology;  Laterality: Right;  . LAPAROSCOPIC OVARIAN CYSTECTOMY Right 03/06/2017   Procedure: LAPAROSCOPIC OVARIAN CYSTECTOMY;  Surgeon: Will Bonnet, MD;  Location: [facility] ORS;  Service: Gynecology;  Laterality: Right;  . LAPAROSCOPY N/A 03/06/2017   Procedure: LAPAROSCOPY OPERATIVE;  Surgeon: Will Bonnet, MD;  Location: [facility] ORS;  Service: Gynecology;  Laterality: N/A;    Family History  Problem Relation Age of Onset  . Hypertension Mother   . Hypertension Father   . Hyperlipidemia Father   . Lymphoma Father 78       non hodgkins   . Brain cancer Paternal Uncle   . Lung cancer Maternal Grandmother   . Breast cancer Paternal Aunt 75  Social History   Socioeconomic History  . Marital status: Married    Spouse name: Not on file  . Number of children: 1  . Years of education: 44  . Highest education level: Not on file  Social Needs  . Financial resource strain: Not on file  . Food insecurity - worry: Not on file  . Food insecurity - inability: Not on file  . Transportation needs - medical: Not on file  . Transportation needs - non-medical: Not on file  Occupational History  . Occupation: business    Comment: Surveyor, minerals  Tobacco Use  . Smoking status: Never Smoker  . Smokeless tobacco:  Never Used  Substance and Sexual Activity  . Alcohol use: Yes    Comment: occ  . Drug use: No  . Sexual activity: Yes    Birth control/protection: None  Other Topics Concern  . Not on file  Social History Narrative  . Not on file    Outpatient Medications Prior to Visit  Medication Sig Dispense Refill  . ibuprofen (ADVIL,MOTRIN) 600 MG tablet Take 1 tablet (600 mg total) by mouth every 6 (six) hours as needed for mild pain or cramping. 30 tablet 0  . lisinopril (ZESTRIL) 10 MG tablet Take 1 tablet (10 mg total) by mouth daily. 30 tablet 0  . methotrexate 50 MG/2ML injection INJECT 0.6 ML SQ ONCE A WEEK  1  . Multiple Vitamin (MULTIVITAMIN WITH MINERALS) TABS tablet Take 1 tablet by mouth daily.    Marland Kitchen ORENCIA CLICKJECT 962 MG/ML SOAJ INJECT ONE pen SUBCUTANEOUSLY every week-ON WEDENSDAYS-PT INSTRUCTED BY RHEUMATOLOGIST TO STOP MED 2 WEEKS PRIOR TO SURGERY AND HOLD FOR 2 WEEKS AFTER SURGERY  6  . ranitidine (ZANTAC) 150 MG tablet Take 150 mg daily as needed by mouth for heartburn.    . Cholecalciferol (VITAMIN D3 PO) Take 1 tablet by mouth daily.    . methyldopa (ALDOMET) 500 MG tablet Take 1 tablet (500 mg total) by mouth 2 (two) times daily. (Patient taking differently: Take 500 mg every evening by mouth. ) 60 tablet 12  . oxyCODONE-acetaminophen (ROXICET) 5-325 MG tablet Take 1 tablet by mouth every 6 (six) hours as needed for severe pain (breakthrough pain). (Patient not taking: Reported on 06/12/2017) 20 tablet 0   No facility-administered medications prior to visit.     ROS:  Review of Systems  Constitutional: Negative for fatigue, fever and unexpected weight change.  Respiratory: Negative for cough, shortness of breath and wheezing.   Cardiovascular: Negative for chest pain, palpitations and leg swelling.  Gastrointestinal: Negative for blood in stool, constipation, diarrhea, nausea and vomiting.  Endocrine: Negative for cold intolerance, heat intolerance and polyuria.    Genitourinary: Negative for dyspareunia, dysuria, flank pain, frequency, genital sores, hematuria, menstrual problem, pelvic pain, urgency, vaginal bleeding, vaginal discharge and vaginal pain.  Musculoskeletal: Negative for back pain, joint swelling and myalgias.  Skin: Negative for rash.  Neurological: Negative for dizziness, syncope, light-headedness, numbness and headaches.  Hematological: Negative for adenopathy.  Psychiatric/Behavioral: Negative for agitation, confusion, sleep disturbance and suicidal ideas. The patient is not nervous/anxious.     OBJECTIVE:   Vitals:  BP (!) 150/90   Pulse 89   Ht 5\' 4"  (1.626 m)   Wt 175 lb (79.4 kg)   LMP 06/18/2017 (Exact Date)   BMI 30.04 kg/m   Physical Exam  Constitutional: She is oriented to person, place, and time and well-developed, well-nourished, and in no distress.  Pulmonary/Chest: Effort normal.  Musculoskeletal: Normal  range of motion.  Neurological: She is alert and oriented to person, place, and time.  Psychiatric: Memory, affect and judgment normal.   Assessment/Plan: Essential hypertension - BP improving from 06/12/17 appt on lisinopril 10 mg dose. Suggested taking home BP. RTO in 1 wk for f/u/sooner prn. No side effects.     Return in about 1 week (around 06/30/2017) for BP check.  Alicia B. Copland, PA-C 06/23/2017 11:16 AM

## 2017-06-23 NOTE — Patient Instructions (Signed)
I value your feedback and entrusting us with your care. If you get a [place] patient survey, I would appreciate you taking the time to let us know about your experience today. Thank you! 

## 2017-06-30 ENCOUNTER — Encounter: Payer: Self-pay | Admitting: Obstetrics and Gynecology

## 2017-06-30 ENCOUNTER — Ambulatory Visit (INDEPENDENT_AMBULATORY_CARE_PROVIDER_SITE_OTHER): Payer: 59 | Admitting: Obstetrics and Gynecology

## 2017-06-30 DIAGNOSIS — I10 Essential (primary) hypertension: Secondary | ICD-10-CM

## 2017-06-30 MED ORDER — LISINOPRIL 20 MG PO TABS: 20.0000 mg | ORAL_TABLET | Freq: Every day | ORAL | 0 refills | Status: DC

## 2017-06-30 NOTE — Patient Instructions (Signed)
I value your feedback and entrusting us with your care. If you get a  patient survey, I would appreciate you taking the time to let us know about your experience today. Thank you! 

## 2017-06-30 NOTE — Progress Notes (Signed)
Patient, No Pcp Per   Chief Complaint  Patient presents with  . Follow-up    BP    HPI:      [name] is a 37 y.o. G1P1001 who LMP was Patient's last menstrual period was 06/18/2017 (exact date). presents today for 2 1/2 wk BP f/u. Pt weaned off methyldopa and started lisinopril 10 mg 06/12/17 due to poor BP control. BP at 06/23/17 f/u was 150/90. Since improved from 06/12/17, pt kept on same dose since had BP control on 10 mg in past. Pt here for BP check today. No SOB/chest pain. Does note headaches with higher BP. No side effects/ACEI cough. Taking BP at home and is about 150/90. Has anxiety about upcoming colon resection 07/04/17.   06/12/17 BP=180/90   Past Medical History:  Diagnosis Date  . ADD (attention deficit disorder)   . Anemia    iron deficiency anemia-LAST HGB 11.8 ON 03-2016  . Arthritis    Rheumatoid arthritis  . Asthma    well-controlled-no inhalers  . GERD (gastroesophageal reflux disease)    occ- rolaids  . Headache   . History of Papanicolaou smear of cervix 04/16/2012   -/-  . Hyperlipemia   . Hypertension   . Pre-eclampsia   . Preeclampsia   . Vitamin D deficiency     Past Surgical History:  Procedure Laterality Date  . DILATATION & CURETTAGE/HYSTEROSCOPY WITH MYOSURE N/A 03/27/2016   Procedure: [place] WITH MYOSURE, MYOMECTOMY;  Surgeon: Gae Dry, MD;  Location: [facility] OR[facility];  Service: Gynecology;  Laterality: N/A;  . DILITATION & CURRETTAGE/HYSTROSCOPY WITH NOVASURE ABLATION N/A 03/09/2015   Procedure: DILATATION & CURETTAGE/HYSTEROSCOPY WITH NOVASURE ABLATION;  Surgeon: Gae Dry, MD;  Location: [facility];  Service: Gynecology;  Laterality: N/A;  . LAPAROSCOPIC BILATERAL SALPINGECTOMY Right 03/06/2017   Procedure: LAPAROSCOPIC  SALPINGECTOMY WITH REMOVAL OF ANY ABNORMAL TISSUE NOTED;  Surgeon: Will Bonnet, MD;  Location: [facility] ORS;  Service: Gynecology;  Laterality: Right;  . LAPAROSCOPIC OVARIAN  CYSTECTOMY Right 03/06/2017   Procedure: LAPAROSCOPIC OVARIAN CYSTECTOMY;  Surgeon: Will Bonnet, MD;  Location: [facility];  Service: Gynecology;  Laterality: Right;  . LAPAROSCOPY N/A 03/06/2017   Procedure: LAPAROSCOPY OPERATIVE;  Surgeon: Will Bonnet, MD;  Location: [facility] ORS;  Service: Gynecology;  Laterality: N/A;    Family History  Problem Relation Age of Onset  . Hypertension Mother   . Hypertension Father   . Hyperlipidemia Father   . Lymphoma Father 53       non hodgkins   . Brain cancer Paternal Uncle   . Lung cancer Maternal Grandmother   . Breast cancer Paternal Aunt 38    Social History   Socioeconomic History  . Marital status: Married    Spouse name: Not on file  . Number of children: 1  . Years of education: 77  . Highest education level: Not on file  Occupational History  . Occupation: business    Comment: Surveyor, minerals  Social Needs  . Financial resource strain: Not on file  . Food insecurity:    Worry: Not on file    Inability: Not on file  . Transportation needs:    Medical: Not on file    Non-medical: Not on file  Tobacco Use  . Smoking status: Never Smoker  . Smokeless tobacco: Never Used  Substance and Sexual Activity  . Alcohol use: Yes    Comment: occ  . Drug use: No  . Sexual  activity: Yes    Birth control/protection: None  Lifestyle  . Physical activity:    Days per week: 0 days    Minutes per session: 0 min  . Stress: Rather much  Relationships  . Social connections:    Talks on phone: Not on file    Gets together: Not on file    Attends religious service: Not on file    Active member of club or organization: Not on file    Attends meetings of clubs or organizations: Not on file    Relationship status: Not on file  . Intimate partner violence:    Fear of current or ex partner: Not on file    Emotionally abused: Not on file    Physically abused: Not on file    Forced sexual activity: Not on file  Other Topics  Concern  . Not on file  Social History Narrative  . Not on file    Outpatient Medications Prior to Visit  Medication Sig Dispense Refill  . ibuprofen (ADVIL,MOTRIN) 600 MG tablet Take 1 tablet (600 mg total) by mouth every 6 (six) hours as needed for mild pain or cramping. 30 tablet 0  . methotrexate 50 MG/2ML injection INJECT 0.6 ML SQ ONCE A WEEK  1  . Multiple Vitamin (MULTIVITAMIN WITH MINERALS) TABS tablet Take 1 tablet by mouth daily.    Marland Kitchen ORENCIA CLICKJECT 409 MG/ML SOAJ INJECT ONE pen SUBCUTANEOUSLY every week-ON WEDENSDAYS-PT INSTRUCTED BY RHEUMATOLOGIST TO STOP MED 2 WEEKS PRIOR TO SURGERY AND HOLD FOR 2 WEEKS AFTER SURGERY  6  . ranitidine (ZANTAC) 150 MG tablet Take 150 mg daily as needed by mouth for heartburn.    Marland Kitchen lisinopril (ZESTRIL) 10 MG tablet Take 1 tablet (10 mg total) by mouth daily. 30 tablet 0   No facility-administered medications prior to visit.       ROS:  Review of Systems  Constitutional: Negative for fever.  Respiratory: Negative for shortness of breath, wheezing and stridor.   Cardiovascular: Negative for chest pain.  Gastrointestinal: Negative for blood in stool, constipation, diarrhea, nausea and vomiting.  Genitourinary: Negative for dyspareunia, dysuria, flank pain, frequency, hematuria, urgency, vaginal bleeding, vaginal discharge and vaginal pain.  Musculoskeletal: Negative for back pain.  Skin: Negative for rash.   BREAST: No symptoms   OBJECTIVE:   Vitals:  BP (!) 150/90   Pulse 86   Ht 5\' 4"  (1.626 m)   Wt 179 lb (81.2 kg)   LMP 06/18/2017 (Exact Date)   BMI 30.73 kg/m   Physical Exam  Constitutional: She is oriented to person, place, and time and well-developed, well-nourished, and in no distress.  Pulmonary/Chest: Effort normal.  Neurological: She is alert and oriented to person, place, and time.  Psychiatric: Memory, affect and judgment normal.  Vitals reviewed.   Assessment/Plan: Essential hypertension - Uncontrolled.  Rx increase to lisinopril 20 mg daily. Rx eRxd. F/u in 3 wks (having surg this 07/04/17 and will be inpatient a few days). F/u sooner prn.  - Plan: lisinopril (PRINIVIL,ZESTRIL) 20 MG tablet Keep home BPs.   Meds ordered this encounter  Medications  . lisinopril (PRINIVIL,ZESTRIL) 20 MG tablet    Sig: Take 1 tablet (20 mg total) by mouth daily.    Dispense:  30 tablet    Refill:  0    Order Specific Question:   Supervising Provider    Answer:   Gae Dry [[id number]]     Return in about 3 weeks (around 07/21/2017) for BP f/u.  Ahyana Skillin B. Solan Vosler, PA-C 06/30/2017 7:54 PM

## 2017-07-04 HISTORY — PX: ABDOMINAL HYSTERECTOMY: SHX81

## 2017-07-29 ENCOUNTER — Encounter: Payer: Self-pay | Admitting: Obstetrics and Gynecology

## 2017-07-29 ENCOUNTER — Ambulatory Visit (INDEPENDENT_AMBULATORY_CARE_PROVIDER_SITE_OTHER): Payer: 59 | Admitting: Obstetrics and Gynecology

## 2017-07-29 VITALS — BP 130/80 | HR 79 | Ht 64.0 in | Wt 168.0 lb

## 2017-07-29 DIAGNOSIS — I10 Essential (primary) hypertension: Secondary | ICD-10-CM | POA: Diagnosis not present

## 2017-07-29 MED ORDER — LISINOPRIL 20 MG PO TABS: 20.0000 mg | ORAL_TABLET | Freq: Every day | ORAL | 1 refills | Status: DC

## 2017-07-29 NOTE — Patient Instructions (Signed)
I value your feedback and entrusting us with your care. If you get a [place] patient survey, I would appreciate you taking the time to let us know about your experience today. Thank you! 

## 2017-07-29 NOTE — Progress Notes (Signed)
Patient, No Pcp Per   Chief Complaint  Patient presents with  . Blood Pressure Check    HPI:      [name] is a 37 y.o. G1P1001 who LMP was No LMP recorded., presents today for 6 1/2 wk BP f/u. Pt weaned off methyldopa and started lisinopril 10 mg 06/12/17 due to poor BP control. BP at 06/23/17 f/u was 150/90. Since improved from 06/12/17, pt kept on same dose since had BP control on 10 mg in past. Dose then increased 06/30/17 since still elevated. Pt here for BP check today. No SOB/chest pain. Headaches from increased BPs resolved. No side effects/ACEI cough. Taking BP at home and was about 150/90 up until last wk, but pt was s/p colon resection for endometriosis and was still in pain. Pain decreased now and pt resting at home. BP yesterday at MD appt was 140/84.   04/15/17 204/120 (pt states she was very stressed) 05/08/17 140/78 05/28/17 149/101 06/12/17  180/90 06/23/17  150/90 06/30/17  150/90   Past Medical History:  Diagnosis Date  . ADD (attention deficit disorder)   . Anemia    iron deficiency anemia-LAST HGB 11.8 ON 03-2016  . Arthritis    Rheumatoid arthritis  . Asthma    well-controlled-no inhalers  . GERD (gastroesophageal reflux disease)    occ- rolaids  . Headache   . History of Papanicolaou smear of cervix 04/16/2012   -/-  . Hyperlipemia   . Hypertension   . Pre-eclampsia   . Preeclampsia   . Vitamin D deficiency     Past Surgical History:  Procedure Laterality Date  . DILATATION & CURETTAGE/HYSTEROSCOPY WITH MYOSURE N/A 03/27/2016   Procedure: [place] WITH MYOSURE, MYOMECTOMY;  Surgeon: Gae Dry, MD;  Location: [facility];  Service: Gynecology;  Laterality: N/A;  . DILITATION & CURRETTAGE/HYSTROSCOPY WITH NOVASURE ABLATION N/A 03/09/2015   Procedure: DILATATION & CURETTAGE/HYSTEROSCOPY WITH NOVASURE ABLATION;  Surgeon: Gae Dry, MD;  Location: [facility];  Service: Gynecology;  Laterality: N/A;  . LAPAROSCOPIC  BILATERAL SALPINGECTOMY Right 03/06/2017   Procedure: LAPAROSCOPIC  SALPINGECTOMY WITH REMOVAL OF ANY ABNORMAL TISSUE NOTED;  Surgeon: Will Bonnet, MD;  Location: [facility];  Service: Gynecology;  Laterality: Right;  . LAPAROSCOPIC OVARIAN CYSTECTOMY Right 03/06/2017   Procedure: LAPAROSCOPIC OVARIAN CYSTECTOMY;  Surgeon: Will Bonnet, MD;  Location: [facility];  Service: Gynecology;  Laterality: Right;  . LAPAROSCOPY N/A 03/06/2017   Procedure: LAPAROSCOPY OPERATIVE;  Surgeon: Will Bonnet, MD;  Location: [facility];  Service: Gynecology;  Laterality: N/A;    Family History  Problem Relation Age of Onset  . Hypertension Mother   . Hypertension Father   . Hyperlipidemia Father   . Lymphoma Father 10       non hodgkins   . Brain cancer Paternal Uncle   . Lung cancer Maternal Grandmother   . Breast cancer Paternal Aunt 9    Social History   Socioeconomic History  . Marital status: Married    Spouse name: Not on file  . Number of children: 1  . Years of education: 1  . Highest education level: Not on file  Occupational History  . Occupation: business    Comment: Surveyor, minerals  Social Needs  . Financial resource strain: Not on file  . Food insecurity:    Worry: Not on file    Inability: Not on file  . Transportation needs:    Medical: Not on file  Non-medical: Not on file  Tobacco Use  . Smoking status: Never Smoker  . Smokeless tobacco: Never Used  Substance and Sexual Activity  . Alcohol use: Yes    Comment: occ  . Drug use: No  . Sexual activity: Yes    Birth control/protection: None  Lifestyle  . Physical activity:    Days per week: 0 days    Minutes per session: 0 min  . Stress: Rather much  Relationships  . Social connections:    Talks on phone: Not on file    Gets together: Not on file    Attends religious service: Not on file    Active member of club or organization: Not on file    Attends meetings of clubs or organizations: Not on  file    Relationship status: Not on file  . Intimate partner violence:    Fear of current or ex partner: Not on file    Emotionally abused: Not on file    Physically abused: Not on file    Forced sexual activity: Not on file  Other Topics Concern  . Not on file  Social History Narrative  . Not on file    Outpatient Medications Prior to Visit  Medication Sig Dispense Refill  . ibuprofen (ADVIL,MOTRIN) 600 MG tablet Take 1 tablet (600 mg total) by mouth every 6 (six) hours as needed for mild pain or cramping. 30 tablet 0  . methotrexate 50 MG/2ML injection INJECT 0.6 ML SQ ONCE A WEEK  1  . Multiple Vitamin (MULTIVITAMIN WITH MINERALS) TABS tablet Take 1 tablet by mouth daily.    Marland Kitchen ORENCIA CLICKJECT 188 MG/ML SOAJ INJECT ONE pen SUBCUTANEOUSLY every week-ON WEDENSDAYS-PT INSTRUCTED BY RHEUMATOLOGIST TO STOP MED 2 WEEKS PRIOR TO SURGERY AND HOLD FOR 2 WEEKS AFTER SURGERY  6  . ranitidine (ZANTAC) 150 MG tablet Take 150 mg daily as needed by mouth for heartburn.    Marland Kitchen lisinopril (PRINIVIL,ZESTRIL) 20 MG tablet Take 1 tablet (20 mg total) by mouth daily. 30 tablet 0   No facility-administered medications prior to visit.     ROS:  Review of Systems  Constitutional: Negative for fever.  Gastrointestinal: Negative for blood in stool, constipation, diarrhea, nausea and vomiting.  Genitourinary: Negative for dyspareunia, dysuria, flank pain, frequency, hematuria, urgency, vaginal bleeding, vaginal discharge and vaginal pain.  Musculoskeletal: Negative for back pain.  Skin: Negative for rash.   BREAST: No symptoms   OBJECTIVE:   Vitals:  BP 130/80   Pulse 79   Ht 5\' 4"  (1.626 m)   Wt 168 lb (76.2 kg)   BMI 28.84 kg/m   Physical Exam  Constitutional: She is oriented to person, place, and time. She appears well-developed.  Pulmonary/Chest: Effort normal.  Neurological: She is alert and oriented to person, place, and time.  Psychiatric: She has a normal mood and affect. Her  behavior is normal. Judgment and thought content normal.    Assessment/Plan: Essential hypertension - BP improved. Pt to cont lisinopril 20 mg dose. Rx RF. RTO in 2 months for annual/ BP f/u. Pt to take BP at home. F/u if >140/90 or if gets too low and pt has sx - Plan: lisinopril (PRINIVIL,ZESTRIL) 20 MG tablet    Meds ordered this encounter  Medications  . lisinopril (PRINIVIL,ZESTRIL) 20 MG tablet    Sig: Take 1 tablet (20 mg total) by mouth daily.    Dispense:  30 tablet    Refill:  1    Order Specific Question:  Supervising Provider    Answer:   Gae Dry [[id number]]      Return in about 2 months (around 2/70/3500) for annual.  Ethaniel Garfield B. Elese Rane, PA-C 07/29/2017 10:03 AM

## 2017-09-30 ENCOUNTER — Ambulatory Visit: Payer: 59 | Admitting: Obstetrics and Gynecology

## 2017-10-15 ENCOUNTER — Encounter: Payer: Self-pay | Admitting: Obstetrics and Gynecology

## 2017-10-15 ENCOUNTER — Ambulatory Visit (INDEPENDENT_AMBULATORY_CARE_PROVIDER_SITE_OTHER): Payer: 59 | Admitting: Obstetrics and Gynecology

## 2017-10-15 VITALS — BP 144/92 | HR 76 | Ht 64.0 in | Wt 172.5 lb

## 2017-10-15 DIAGNOSIS — I10 Essential (primary) hypertension: Secondary | ICD-10-CM | POA: Diagnosis not present

## 2017-10-15 DIAGNOSIS — N898 Other specified noninflammatory disorders of vagina: Secondary | ICD-10-CM

## 2017-10-15 DIAGNOSIS — Z01419 Encounter for gynecological examination (general) (routine) without abnormal findings: Secondary | ICD-10-CM

## 2017-10-15 DIAGNOSIS — N941 Unspecified dyspareunia: Secondary | ICD-10-CM

## 2017-10-15 DIAGNOSIS — Z01411 Encounter for gynecological examination (general) (routine) with abnormal findings: Secondary | ICD-10-CM

## 2017-10-15 LAB — POCT WET PREP WITH KOH
Clue Cells Wet Prep HPF POC: NEGATIVE
KOH Prep POC: NEGATIVE
TRICHOMONAS UA: NEGATIVE
YEAST WET PREP PER HPF POC: NEGATIVE

## 2017-10-15 MED ORDER — LISINOPRIL 20 MG PO TABS: 20.0000 mg | ORAL_TABLET | Freq: Every day | ORAL | 3 refills | Status: DC

## 2017-10-15 NOTE — Progress Notes (Signed)
Chief Complaint  Patient presents with  . Gynecologic Exam    poss yeast infection, vaginal discomfort      HPI:      [name] is a 37 y.o. G1P1001 who LMP was No LMP recorded. (Menstrual status: Other)., presents today for her annual examination.  Her menses are absent due to TAH 3/19 at [facility] due to endometriosis. Had colon resection at that time due to endometriosis as well.  Sx much improved and pt feeling better.    Sex activity: single partner, contraception -hyst. Pt has had internal irritation with sex the past few wks. No increased d/c, odor. No sx if not sex active. Has tried lubricants with some improvement but not resolution. No recent abx use.   Last Pap: May 30, 2015  Results were: no abnormalities /neg HPV DNA   There is a FH of breast cancer in her pat aunt, genetic testing not indicated. There is no FH of ovarian cancer. The patient does do self-breast exams.  Tobacco use: The patient denies current or previous tobacco use. Alcohol use: social drinker Exercise: moderately active  She does get adequate calcium and Vitamin D in her diet.   We are managing her HTN. She is on lisinopril 20 mg daily, changed from methyldopa this yr since it was no longer controlling her BP. No side effects/ACEI cough. She takes BPs at home and they are about 120s/80s. Highest has been 138/93. Pt had a stressful morning today.   Past Medical History:  Diagnosis Date  . ADD (attention deficit disorder)   . Anemia    iron deficiency anemia-LAST HGB 11.8 ON 03-2016  . Arthritis    Rheumatoid arthritis  . Asthma    well-controlled-no inhalers  . GERD (gastroesophageal reflux disease)    occ- rolaids  . Headache   . History of Papanicolaou smear of cervix 04/16/2012   -/-  . Hyperlipemia   . Hypertension   . Pre-eclampsia   . Preeclampsia   . Vitamin D deficiency     Past Surgical History:  Procedure Laterality Date  . ABDOMINAL HYSTERECTOMY  07/04/2017  .  DILATATION & CURETTAGE/HYSTEROSCOPY WITH MYOSURE N/A 03/27/2016   Procedure: [place] WITH MYOSURE, MYOMECTOMY;  Surgeon: Gae Dry, MD;  Location: [facility];  Service: Gynecology;  Laterality: N/A;  . DILITATION & CURRETTAGE/HYSTROSCOPY WITH NOVASURE ABLATION N/A 03/09/2015   Procedure: DILATATION & CURETTAGE/HYSTEROSCOPY WITH NOVASURE ABLATION;  Surgeon: Gae Dry, MD;  Location: [facility];  Service: Gynecology;  Laterality: N/A;  . LAPAROSCOPIC BILATERAL SALPINGECTOMY Right 03/06/2017   Procedure: LAPAROSCOPIC  SALPINGECTOMY WITH REMOVAL OF ANY ABNORMAL TISSUE NOTED;  Surgeon: Will Bonnet, MD;  Location: [facility];  Service: Gynecology;  Laterality: Right;  . LAPAROSCOPIC OVARIAN CYSTECTOMY Right 03/06/2017   Procedure: LAPAROSCOPIC OVARIAN CYSTECTOMY;  Surgeon: Will Bonnet, MD;  Location: [facility];  Service: Gynecology;  Laterality: Right;  . LAPAROSCOPY N/A 03/06/2017   Procedure: LAPAROSCOPY OPERATIVE;  Surgeon: Will Bonnet, MD;  Location: [facility];  Service: Gynecology;  Laterality: N/A;    Family History  Problem Relation Age of Onset  . Hypertension Mother   . Hypertension Father   . Hyperlipidemia Father   . Lymphoma Father 8       non hodgkins   . Brain cancer Paternal Uncle   . Lung cancer Maternal Grandmother   . Breast cancer Paternal Aunt 39    Social History   Socioeconomic History  . Marital  status: Married    Spouse name: Not on file  . Number of children: 1  . Years of education: 89  . Highest education level: Not on file  Occupational History  . Occupation: business    Comment: Surveyor, minerals  Social Needs  . Financial resource strain: Not on file  . Food insecurity:    Worry: Not on file    Inability: Not on file  . Transportation needs:    Medical: Not on file    Non-medical: Not on file  Tobacco Use  . Smoking status: Never Smoker  . Smokeless tobacco: Never Used  Substance and Sexual  Activity  . Alcohol use: Yes    Comment: occ  . Drug use: No  . Sexual activity: Yes    Birth control/protection: None, Surgical    Comment: Hysterectomy   Lifestyle  . Physical activity:    Days per week: 0 days    Minutes per session: 0 min  . Stress: Rather much  Relationships  . Social connections:    Talks on phone: Not on file    Gets together: Not on file    Attends religious service: Not on file    Active member of club or organization: Not on file    Attends meetings of clubs or organizations: Not on file    Relationship status: Not on file  . Intimate partner violence:    Fear of current or ex partner: Not on file    Emotionally abused: Not on file    Physically abused: Not on file    Forced sexual activity: Not on file  Other Topics Concern  . Not on file  Social History Narrative  . Not on file    Current Outpatient Medications on File Prior to Visit  Medication Sig Dispense Refill  . ibuprofen (ADVIL,MOTRIN) 600 MG tablet Take 1 tablet (600 mg total) by mouth every 6 (six) hours as needed for mild pain or cramping. 30 tablet 0  . methotrexate 50 MG/2ML injection INJECT 0.6 ML SQ ONCE A WEEK  1  . Multiple Vitamin (MULTIVITAMIN WITH MINERALS) TABS tablet Take 1 tablet by mouth daily.    Marland Kitchen ORENCIA CLICKJECT 272 MG/ML SOAJ INJECT ONE pen SUBCUTANEOUSLY every week-ON WEDENSDAYS-PT INSTRUCTED BY RHEUMATOLOGIST TO STOP MED 2 WEEKS PRIOR TO SURGERY AND HOLD FOR 2 WEEKS AFTER SURGERY  6  . ranitidine (ZANTAC) 150 MG tablet Take 150 mg daily as needed by mouth for heartburn.     No current facility-administered medications on file prior to visit.      ROS:  Review of Systems  Constitutional: Negative for fatigue, fever and unexpected weight change.  Respiratory: Negative for cough, shortness of breath and wheezing.   Cardiovascular: Negative for chest pain, palpitations and leg swelling.  Gastrointestinal: Negative for blood in stool, constipation, diarrhea,  nausea and vomiting.  Endocrine: Negative for cold intolerance, heat intolerance and polyuria.  Genitourinary: Positive for dyspareunia. Negative for dysuria, flank pain, frequency, genital sores, hematuria, menstrual problem, pelvic pain, urgency, vaginal bleeding, vaginal discharge and vaginal pain.  Musculoskeletal: Negative for back pain, joint swelling and myalgias.  Skin: Negative for rash.  Neurological: Negative for dizziness, syncope, light-headedness, numbness and headaches.  Hematological: Negative for adenopathy.  Psychiatric/Behavioral: Negative for agitation, confusion, sleep disturbance and suicidal ideas. The patient is not nervous/anxious.      Objective: BP (!) 144/92   Pulse 76   Ht 5\' 4"  (1.626 m)   Wt 172 lb 8 oz (78.2  kg)   BMI 29.61 kg/m    Physical Exam  Constitutional: She is oriented to person, place, and time. She appears well-developed and well-nourished.  Genitourinary: Vagina normal. There is no rash or tenderness on the right labia. There is no rash or tenderness on the left labia. No erythema or tenderness in the vagina. No vaginal discharge found. Right adnexum does not display mass and does not display tenderness. Left adnexum does not display mass and does not display tenderness.  Genitourinary Comments: UTERUS/CX SURG REM  Neck: Normal range of motion. No thyromegaly present.  Cardiovascular: Normal rate, regular rhythm and normal heart sounds.  No murmur heard. Pulmonary/Chest: Effort normal and breath sounds normal. Right breast exhibits no mass, no nipple discharge, no skin change and no tenderness. Left breast exhibits no mass, no nipple discharge, no skin change and no tenderness.  Abdominal: Soft. There is no tenderness. There is no guarding.  Musculoskeletal: Normal range of motion.  Neurological: She is alert and oriented to person, place, and time. No cranial nerve deficit.  Psychiatric: She has a normal mood and affect. Her behavior is  normal.  Vitals reviewed.   Results: Results for orders placed or performed in visit on 10/15/17 (from the past 24 hour(s))  POCT Wet Prep with KOH     Status: Normal   Collection Time: 10/15/17 11:34 AM  Result Value Ref Range   Trichomonas, UA Negative    Clue Cells Wet Prep HPF POC neg    Epithelial Wet Prep HPF POC  Few, Moderate, Many, Too numerous to count   Yeast Wet Prep HPF POC neg    Bacteria Wet Prep HPF POC  Few   RBC Wet Prep HPF POC     WBC Wet Prep HPF POC     KOH Prep POC Negative Negative    Assessment/Plan: Encounter for annual routine gynecological examination  Essential hypertension - Controlled with lisinopril 20 mg daily per home BP check. Rx RF. F/u if BP > 140/90. - Plan: lisinopril (PRINIVIL,ZESTRIL) 20 MG tablet  Dyspareunia in female - Neg wet prep/exam. Question healing from TAH. Try coconut oil as lubricant. F/u prn.  Vaginal irritation - Plan: POCT Wet Prep with KOH  Meds ordered this encounter  Medications  . lisinopril (PRINIVIL,ZESTRIL) 20 MG tablet    Sig: Take 1 tablet (20 mg total) by mouth daily.    Dispense:  90 tablet    Refill:  3    Order Specific Question:   Supervising Provider    Answer:   Gae Dry [[id number]]             GYN counsel menopause, adequate intake of calcium and vitamin D, diet and exercise     F/U  Return in about 1 year (around 2/95/2841) for annual.  Alicia B. Copland, PA-C 10/15/2017 3:02 PM

## 2017-10-15 NOTE — Patient Instructions (Signed)
I value your feedback and entrusting us with your care. If you get a [facility] patient survey, I would appreciate you taking the time to let us know about your experience today. Thank you! 

## 2017-11-05 ENCOUNTER — Other Ambulatory Visit: Payer: Self-pay | Admitting: Obstetrics and Gynecology

## 2017-11-05 DIAGNOSIS — I10 Essential (primary) hypertension: Secondary | ICD-10-CM

## 2017-12-16 ENCOUNTER — Other Ambulatory Visit: Payer: Self-pay

## 2017-12-16 ENCOUNTER — Ambulatory Visit
Admission: EM | Admit: 2017-12-16 | Discharge: 2017-12-16 | Disposition: A | Discharge status: Home / Self Care | Payer: 59 | Attending: Family Medicine | Admitting: Family Medicine

## 2017-12-16 DIAGNOSIS — J0100 Acute maxillary sinusitis, unspecified: Secondary | ICD-10-CM | POA: Diagnosis not present

## 2017-12-16 MED ORDER — DOXYCYCLINE HYCLATE 100 MG PO CAPS: 100.0000 mg | ORAL_CAPSULE | Freq: Two times a day (BID) | ORAL | 0 refills | Status: DC

## 2017-12-16 NOTE — ED Triage Notes (Signed)
Patient complains of sinus pain and pressure, nasal congestion, hoarseness, and cough x 3-4 weeks.

## 2017-12-16 NOTE — ED Provider Notes (Signed)
[facility]    CSN: [id number] Arrival date & time: 12/16/17  1410  History   Chief Complaint Chief Complaint  Patient presents with  . Sinusitis   HPI  37 year old female presents with concerns for sinusitis.  3-week history of sinus pain, pressure, congestion, hoarseness.  Associated cough.  She has used numerous over-the-counter medications without improvement.  No current fever.  No chills.  She is on immunosuppressant therapy for RA. No known exacerbating factors. No other complaints or concerns.  PMH, Surgical Hx, Family Hx, Social History reviewed and updated as below.  Past Medical History:  Diagnosis Date  . ADD (attention deficit disorder)   . Anemia    iron deficiency anemia-LAST HGB 11.8 ON 03-2016  . Arthritis    Rheumatoid arthritis  . Asthma    well-controlled-no inhalers  . GERD (gastroesophageal reflux disease)    occ- rolaids  . Headache   . History of Papanicolaou smear of cervix 04/16/2012   -/-  . Hyperlipemia   . Hypertension   . Pre-eclampsia   . Preeclampsia   . Vitamin D deficiency     Patient Active Problem List   Diagnosis Date Noted  . Chronic pelvic pain in female 03/12/2017  . Endometriosis determined by laparoscopy 03/06/2017  . Endometrioma 01/27/2017  . Hydrosalpinx 01/27/2017  . Dysmenorrhea 12/24/2016  . ADD (attention deficit disorder) 07/18/2016  . Hypertension 07/18/2016  . Hyperlipidemia 07/17/2016  . Vitamin D deficiency 07/17/2016  . Menorrhagia 03/09/2015  . 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 Migraines 10/28/2013  . Rheumatoid arthritis with rheumatoid factor ([place]) 10/28/2013  . Spina bifida ([place]) 10/28/2013    Past Surgical History:  Procedure Laterality Date  . ABDOMINAL HYSTERECTOMY  07/04/2017  . DILATATION & CURETTAGE/HYSTEROSCOPY WITH MYOSURE N/A 03/27/2016   Procedure: [place] WITH MYOSURE, MYOMECTOMY;  Surgeon: Gae Dry, MD;  Location:  [facility];  Service: Gynecology;  Laterality: N/A;  . DILITATION & CURRETTAGE/HYSTROSCOPY WITH NOVASURE ABLATION N/A 03/09/2015   Procedure: DILATATION & CURETTAGE/HYSTEROSCOPY WITH NOVASURE ABLATION;  Surgeon: Gae Dry, MD;  Location: [facility];  Service: Gynecology;  Laterality: N/A;  . LAPAROSCOPIC BILATERAL SALPINGECTOMY Right 03/06/2017   Procedure: LAPAROSCOPIC  SALPINGECTOMY WITH REMOVAL OF ANY ABNORMAL TISSUE NOTED;  Surgeon: Will Bonnet, MD;  Location: [facility];  Service: Gynecology;  Laterality: Right;  . LAPAROSCOPIC OVARIAN CYSTECTOMY Right 03/06/2017   Procedure: LAPAROSCOPIC OVARIAN CYSTECTOMY;  Surgeon: Will Bonnet, MD;  Location: [facility];  Service: Gynecology;  Laterality: Right;  . LAPAROSCOPY N/A 03/06/2017   Procedure: LAPAROSCOPY OPERATIVE;  Surgeon: Will Bonnet, MD;  Location: [facility];  Service: Gynecology;  Laterality: N/A;    OB History    Gravida  1   Para  1   Term  1   Preterm      AB      Living  1     SAB      TAB      Ectopic      Multiple      Live Births  1            Home Medications    Prior to Admission medications   Medication Sig Start Date End Date Taking? Authorizing Provider  ibuprofen (ADVIL,MOTRIN) 600 MG tablet Take 1 tablet (600 mg total) by mouth every 6 (six) hours as needed for mild pain or cramping. 03/06/17  Yes Will Bonnet, MD  lisinopril (PRINIVIL,ZESTRIL) 20 MG tablet Take 1  tablet (20 mg total) by mouth daily. 2/77/41  Yes Copland, Alicia B, PA-C  methotrexate 50 MG/2ML injection INJECT 0.6 ML SQ ONCE A WEEK 04/10/17  Yes [provider]  Multiple Vitamin (MULTIVITAMIN WITH MINERALS) TABS tablet Take 1 tablet by mouth daily.   Yes [provider]  ORENCIA CLICKJECT 287 MG/ML SOAJ INJECT ONE pen SUBCUTANEOUSLY every week-ON WEDENSDAYS-PT INSTRUCTED BY RHEUMATOLOGIST TO STOP MED 2 WEEKS PRIOR TO SURGERY AND HOLD FOR 2 WEEKS AFTER SURGERY 06/10/16  Yes [provider]  ranitidine (ZANTAC) 150 MG tablet Take 150 mg daily as needed by mouth for heartburn.   Yes [provider]  doxycycline (VIBRAMYCIN) 100 MG capsule Take 1 capsule (100 mg total) by mouth 2 (two) times daily. 12/16/17   Coral Spikes, DO    Family History Family History  Problem Relation Age of Onset  . Hypertension Mother   . Hypertension Father   . Hyperlipidemia Father   . Lymphoma Father 73       non hodgkins   . Brain cancer Paternal Uncle   . Lung cancer Maternal Grandmother   . Breast cancer Paternal Aunt 76    Social History Social History   Tobacco Use  . Smoking status: Never Smoker  . Smokeless tobacco: Never Used  Substance Use Topics  . Alcohol use: Not Currently  . Drug use: No     Allergies   Cefaclor; Penicillins; Prednisone; Sulfa antibiotics; and Sulfasalazine   Review of Systems Review of Systems  Constitutional: Negative.   HENT: Positive for congestion, sinus pressure and sinus pain.   Respiratory: Positive for cough.    Physical Exam Triage Vital Signs ED Triage Vitals  Enc Vitals Group     BP 12/16/17 1442 (!) 150/109     Pulse Rate 12/16/17 1442 95     Resp 12/16/17 1442 18     Temp 12/16/17 1442 98.3 F (36.8 C)     Temp Source 12/16/17 1442 Oral     SpO2 12/16/17 1442 100 %     Weight 12/16/17 1440 173 lb (78.5 kg)     Height 12/16/17 1440 5\' 4"  (1.626 m)     Head Circumference --      Peak Flow --      Pain Score 12/16/17 1439 4     Pain Loc --      Pain Edu? --      Excl. in [place]? --    Updated Vital Signs BP (!) 150/109   Pulse 95   Temp 98.3 F (36.8 C) (Oral)   Resp 18   Ht 5\' 4"  (1.626 m)   Wt 78.5 kg   LMP 06/18/2017 (Exact Date)   SpO2 100%   BMI 29.70 kg/m   Visual Acuity Right Eye Distance:   Left Eye Distance:   Bilateral Distance:    Right Eye Near:   Left Eye Near:    Bilateral Near:     Physical Exam  Constitutional: She is oriented to person, place, and time. She appears  well-developed. No distress.  HENT:  Maxillary and frontal sinus tenderness to palpation.  Cardiovascular: Normal rate and regular rhythm.  Pulmonary/Chest: Effort normal and breath sounds normal. She has no wheezes. She has no rales.  Neurological: She is alert and oriented to person, place, and time.  Psychiatric: She has a normal mood and affect. Her behavior is normal.  Nursing note and vitals reviewed.  UC Treatments / Results  Labs (all labs  ordered are listed, but only abnormal results are displayed) Labs Reviewed - No data to display  EKG None  Radiology No results found.  Procedures Procedures (including critical care time)  Medications Ordered in UC Medications - No data to display  Initial Impression / Assessment and Plan / UC Course  I have reviewed the triage vital signs and the nursing notes.  Pertinent labs & imaging results that were available during my care of the patient were reviewed by me and considered in my medical decision making (see chart for details).    37 year old female presents with subacute maxillary sinusitis.  Treating with doxycycline.  Final Clinical Impressions(s) / UC Diagnoses   Final diagnoses:  Subacute maxillary sinusitis   Discharge Instructions   None    ED Prescriptions    Medication Sig Dispense Auth. Provider   doxycycline (VIBRAMYCIN) 100 MG capsule Take 1 capsule (100 mg total) by mouth 2 (two) times daily. 14 capsule Coral Spikes, DO     Controlled Substance Prescriptions [place] Controlled Substance Registry consulted? Not Applicable   Coral Spikes, DO 12/16/17 1802

## 2018-04-09 ENCOUNTER — Ambulatory Visit
Admission: EM | Admit: 2018-04-09 | Discharge: 2018-04-09 | Disposition: A | Discharge status: Home / Self Care | Payer: 59 | Attending: Family Medicine | Admitting: Family Medicine

## 2018-04-09 ENCOUNTER — Other Ambulatory Visit: Payer: Self-pay

## 2018-04-09 ENCOUNTER — Encounter: Payer: Self-pay | Admitting: Gynecology

## 2018-04-09 DIAGNOSIS — J320 Chronic maxillary sinusitis: Secondary | ICD-10-CM | POA: Diagnosis not present

## 2018-04-09 DIAGNOSIS — J069 Acute upper respiratory infection, unspecified: Secondary | ICD-10-CM | POA: Insufficient documentation

## 2018-04-09 MED ORDER — HYDROCOD POLST-CPM POLST ER 10-8 MG/5ML PO SUER: 5.0000 mL | Freq: Two times a day (BID) | ORAL | 0 refills | Status: DC

## 2018-04-09 MED ORDER — DOXYCYCLINE HYCLATE 100 MG PO CAPS: 100.0000 mg | ORAL_CAPSULE | Freq: Two times a day (BID) | ORAL | 0 refills | Status: DC

## 2018-04-09 MED ORDER — FLUTICASONE PROPIONATE 50 MCG/ACT NA SUSP: 2.0000 | Freq: Every day | NASAL | 0 refills | Status: AC

## 2018-04-09 MED ORDER — BENZONATATE 200 MG PO CAPS: ORAL_CAPSULE | ORAL | 0 refills | Status: DC

## 2018-04-09 NOTE — ED Provider Notes (Signed)
[facility]    CSN: [id number] Arrival date & time: 04/09/18  1325     History   Chief Complaint Chief Complaint  Patient presents with  . Sinusitis    HPI Angelica Banks is a 38 y.o. female.   HPI  -year-old female presents with a cough sinus drainage that she has had for over a week.  Pain over the frontal sinuses.  Seen here in September with a subacute maxillary sinusitis.  The cough has been present for over a week and now she has congestion in her chest.  The sputum is not productive at this point.  Not smoke.  Denies fever or chills.       Past Medical History:  Diagnosis Date  . ADD (attention deficit disorder)   . Anemia    iron deficiency anemia-LAST HGB 11.8 ON 03-2016  . Arthritis    Rheumatoid arthritis  . Asthma    well-controlled-no inhalers  . GERD (gastroesophageal reflux disease)    occ- rolaids  . Headache   . History of Papanicolaou smear of cervix 04/16/2012   -/-  . Hyperlipemia   . Hypertension   . Pre-eclampsia   . Preeclampsia   . Vitamin D deficiency     Patient Active Problem List   Diagnosis Date Noted  . Chronic pelvic pain in female 03/12/2017  . Endometriosis determined by laparoscopy 03/06/2017  . Endometrioma 01/27/2017  . Hydrosalpinx 01/27/2017  . Dysmenorrhea 12/24/2016  . ADD (attention deficit disorder) 07/18/2016  . Hypertension 07/18/2016  . Hyperlipidemia 07/17/2016  . Vitamin D deficiency 07/17/2016  . Menorrhagia 03/09/2015  . 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 Migraines 10/28/2013  . Rheumatoid arthritis with rheumatoid factor ([place]) 10/28/2013  . Spina bifida ([place]) 10/28/2013    Past Surgical History:  Procedure Laterality Date  . ABDOMINAL HYSTERECTOMY  07/04/2017  . DILATATION & CURETTAGE/HYSTEROSCOPY WITH MYOSURE N/A 03/27/2016   Procedure: [place] WITH MYOSURE, MYOMECTOMY;  Surgeon: Gae Dry, MD;  Location: [facility];  Service:  Gynecology;  Laterality: N/A;  . DILITATION & CURRETTAGE/HYSTROSCOPY WITH NOVASURE ABLATION N/A 03/09/2015   Procedure: DILATATION & CURETTAGE/HYSTEROSCOPY WITH NOVASURE ABLATION;  Surgeon: Gae Dry, MD;  Location: [facility];  Service: Gynecology;  Laterality: N/A;  . LAPAROSCOPIC BILATERAL SALPINGECTOMY Right 03/06/2017   Procedure: LAPAROSCOPIC  SALPINGECTOMY WITH REMOVAL OF ANY ABNORMAL TISSUE NOTED;  Surgeon: Will Bonnet, MD;  Location: [facility];  Service: Gynecology;  Laterality: Right;  . LAPAROSCOPIC OVARIAN CYSTECTOMY Right 03/06/2017   Procedure: LAPAROSCOPIC OVARIAN CYSTECTOMY;  Surgeon: Will Bonnet, MD;  Location: [facility];  Service: Gynecology;  Laterality: Right;  . LAPAROSCOPY N/A 03/06/2017   Procedure: LAPAROSCOPY OPERATIVE;  Surgeon: Will Bonnet, MD;  Location: [facility];  Service: Gynecology;  Laterality: N/A;    OB History    Gravida  1   Para  1   Term  1   Preterm      AB      Living  1     SAB      TAB      Ectopic      Multiple      Live Births  1            Home Medications    Prior to Admission medications   Medication Sig Start Date End Date Taking? Authorizing Provider  ibuprofen (ADVIL,MOTRIN) 600 MG tablet Take 1 tablet (600 mg total) by mouth every 6 (six) hours  as needed for mild pain or cramping. 03/06/17  Yes Will Bonnet, MD  lisinopril (PRINIVIL,ZESTRIL) 20 MG tablet Take 1 tablet (20 mg total) by mouth daily. 0/35/00  Yes Copland, Alicia B, PA-C  methotrexate 50 MG/2ML injection INJECT 0.6 ML SQ ONCE A WEEK 04/10/17  Yes [provider]  methylPREDNISolone (MEDROL) 4 MG tablet Take by mouth. 04/02/18 05/02/18 Yes [provider]  Multiple Vitamin (MULTIVITAMIN WITH MINERALS) TABS tablet Take 1 tablet by mouth daily.   Yes [provider]  Tofacitinib Citrate 5 MG TABS Take by mouth. 01/07/18  Yes [provider]  benzonatate (TESSALON) 200 MG capsule Take one cap TID  PRN cough 04/09/18   Lorin Picket, PA-C  chlorpheniramine-HYDROcodone [facility]) 10-8 MG/5ML SUER Take 5 mLs by mouth 2 (two) times daily. 04/09/18   Lorin Picket, PA-C  doxycycline (VIBRAMYCIN) 100 MG capsule Take 1 capsule (100 mg total) by mouth 2 (two) times daily. 04/09/18   Lorin Picket, PA-C  fluticasone (FLONASE) 50 MCG/ACT nasal spray Place 2 sprays into both nostrils daily. 04/09/18   Lorin Picket, PA-C  ORENCIA CLICKJECT 938 MG/ML SOAJ INJECT ONE pen SUBCUTANEOUSLY every week-ON WEDENSDAYS-PT INSTRUCTED BY RHEUMATOLOGIST TO STOP MED 2 WEEKS PRIOR TO SURGERY AND HOLD FOR 2 WEEKS AFTER SURGERY 06/10/16   [provider]    Family History Family History  Problem Relation Age of Onset  . Hypertension Mother   . Hypertension Father   . Hyperlipidemia Father   . Lymphoma Father 72       non hodgkins   . Brain cancer Paternal Uncle   . Lung cancer Maternal Grandmother   . Breast cancer Paternal Aunt 40    Social History Social History   Tobacco Use  . Smoking status: Never Smoker  . Smokeless tobacco: Never Used  Substance Use Topics  . Alcohol use: Not Currently  . Drug use: No     Allergies   Cefaclor; Penicillins; Prednisone; Sulfa antibiotics; and Sulfasalazine   Review of Systems Review of Systems  Constitutional: Positive for activity change. Negative for appetite change, chills, fatigue and fever.  HENT: Positive for congestion, postnasal drip, sinus pressure and sinus pain.   Respiratory: Positive for cough and shortness of breath.   All other systems reviewed and are negative.    Physical Exam Triage Vital Signs ED Triage Vitals  Enc Vitals Group     BP 04/09/18 1425 (!) 138/102     Pulse Rate 04/09/18 1425 72     Resp 04/09/18 1425 16     Temp 04/09/18 1425 98.2 F (36.8 C)     Temp Source 04/09/18 1425 Oral     SpO2 04/09/18 1425 98 %     Weight 04/09/18 1424 170 lb (77.1 kg)     Height 04/09/18 1424 5\' 4"   (1.626 m)     Head Circumference --      Peak Flow --      Pain Score 04/09/18 1423 0     Pain Loc --      Pain Edu? --      Excl. in [place]? --    No data found.  Updated Vital Signs BP (!) 138/102 (BP Location: Left Arm)   Pulse 72   Temp 98.2 F (36.8 C) (Oral)   Resp 16   Ht 5\' 4"  (1.626 m)   Wt 170 lb (77.1 kg)   LMP 06/18/2017 (Exact Date)   SpO2 98%  BMI 29.18 kg/m   Visual Acuity Right Eye Distance:   Left Eye Distance:   Bilateral Distance:    Right Eye Near:   Left Eye Near:    Bilateral Near:     Physical Exam Vitals signs and nursing note reviewed.  Constitutional:      General: She is not in acute distress.    Appearance: Normal appearance. She is not ill-appearing, toxic-appearing or diaphoretic.  HENT:     Head: Normocephalic.     Comments: Patient has tenderness percussion mostly over the maxillary sinuses less so over frontal    Right Ear: Tympanic membrane and ear canal normal.     Left Ear: Tympanic membrane and ear canal normal.     Nose: Congestion and rhinorrhea present.     Mouth/Throat:     Mouth: Mucous membranes are moist.     Pharynx: No oropharyngeal exudate or posterior oropharyngeal erythema.  Eyes:     General:        Right eye: No discharge.        Left eye: No discharge.     Conjunctiva/sclera: Conjunctivae normal.     Pupils: Pupils are equal, round, and reactive to light.  Pulmonary:     Effort: Pulmonary effort is normal.     Breath sounds: Normal breath sounds.  Musculoskeletal: Normal range of motion.  Skin:    General: Skin is warm and dry.  Neurological:     General: No focal deficit present.     Mental Status: She is alert and oriented to person, place, and time.  Psychiatric:        Mood and Affect: Mood normal.        Behavior: Behavior normal.        Thought Content: Thought content normal.        Judgment: Judgment normal.      UC Treatments / Results  Labs (all labs ordered are listed, but only abnormal  results are displayed) Labs Reviewed - No data to display  EKG None  Radiology No results found.  Procedures Procedures (including critical care time)  Medications Ordered in UC Medications - No data to display  Initial Impression / Assessment and Plan / UC Course  I have reviewed the triage vital signs and the nursing notes.  Pertinent labs & imaging results that were available during my care of the patient were reviewed by me and considered in my medical decision making (see chart for details).   Patient has an upper respiratory infection along with a sinusitis to be chronic.  Also immunosuppressed on RA therapy.  We will treat her with doxycycline for 7 days.  Give her cough suppressants.  Urged her to use Flonase nasal spray to on a daily basis for 2 to 3 weeks.  Up with primary care if she is not improving   Final Clinical Impressions(s) / UC Diagnoses   Final diagnoses:  Acute upper respiratory infection  Chronic maxillary sinusitis     Discharge Instructions     Use Flonase nasal spray daily for the next 2 to 3 weeks.  Drink plenty of fluids.   ED Prescriptions    Medication Sig Dispense Auth. Provider   doxycycline (VIBRAMYCIN) 100 MG capsule Take 1 capsule (100 mg total) by mouth 2 (two) times daily. 14 capsule Crecencio Mc P, PA-C   benzonatate (TESSALON) 200 MG capsule Take one cap TID PRN cough 30 capsule Lorin Picket, PA-C   chlorpheniramine-HYDROcodone (TUSSIONEX PENNKINETIC ER) 10-8  MG/5ML SUER Take 5 mLs by mouth 2 (two) times daily. 115 mL Crecencio Mc P, PA-C   fluticasone (FLONASE) 50 MCG/ACT nasal spray Place 2 sprays into both nostrils daily. 16 g Lorin Picket, PA-C     Controlled Substance Prescriptions [place] Controlled Substance Registry consulted? Not Applicable   Lorin Picket, PA-C 04/09/18 1605

## 2018-04-09 NOTE — ED Triage Notes (Signed)
Per patient c/o sinus drainagee / cough x 1 week

## 2018-04-09 NOTE — Discharge Instructions (Signed)
Use Flonase nasal spray daily for the next 2 to 3 weeks.  Drink plenty of fluids.

## 2018-06-17 ENCOUNTER — Other Ambulatory Visit: Payer: Self-pay

## 2018-06-17 ENCOUNTER — Ambulatory Visit: Payer: Self-pay | Admitting: Obstetrics and Gynecology

## 2018-06-17 ENCOUNTER — Ambulatory Visit (INDEPENDENT_AMBULATORY_CARE_PROVIDER_SITE_OTHER): Payer: 59 | Admitting: Obstetrics and Gynecology

## 2018-06-17 ENCOUNTER — Encounter: Payer: Self-pay | Admitting: Obstetrics and Gynecology

## 2018-06-17 VITALS — BP 120/82 | HR 81 | Ht 64.0 in | Wt 183.0 lb

## 2018-06-17 DIAGNOSIS — B373 Candidiasis of vulva and vagina: Secondary | ICD-10-CM | POA: Diagnosis not present

## 2018-06-17 DIAGNOSIS — N760 Acute vaginitis: Secondary | ICD-10-CM | POA: Diagnosis not present

## 2018-06-17 DIAGNOSIS — B3731 Acute candidiasis of vulva and vagina: Secondary | ICD-10-CM

## 2018-06-17 DIAGNOSIS — B9689 Other specified bacterial agents as the cause of diseases classified elsewhere: Secondary | ICD-10-CM | POA: Diagnosis not present

## 2018-06-17 LAB — POCT WET PREP WITH KOH
Clue Cells Wet Prep HPF POC: POSITIVE
KOH Prep POC: POSITIVE — AB
Trichomonas, UA: NEGATIVE
Yeast Wet Prep HPF POC: POSITIVE

## 2018-06-17 MED ORDER — SECNIDAZOLE 2 G PO PACK: 2.0000 g | PACK | Freq: Once | ORAL | 0 refills | Status: AC

## 2018-06-17 MED ORDER — FLUCONAZOLE 150 MG PO TABS: 150.0000 mg | ORAL_TABLET | Freq: Once | ORAL | 0 refills | Status: AC

## 2018-06-17 NOTE — Progress Notes (Signed)
Patient, No Pcp Per   Chief Complaint  Patient presents with  . Vaginal odor    no discharge, vaginal area is/feels swollen, irritation, no itchiness x on/off for last 2 months    HPI:      [name] is a 38 y.o. [id number] who LMP was Patient's last menstrual period was 06/18/2017 (exact date)., presents today for increased d/c with irritation/feels swollen, sx intermittent for a couple months. Pt with fishy odor for 2 days. No LBP, belly pain, fevers, urin sx. Recent abx about 2 months ago. Hx of BV in distant past. Has vaginal dryness in general, since hyst last yr. Uses summers eve soap (dove sens skin burns), no dryer sheets, uses cottonelle wipes.  Annual/pap due 8/20  Past Medical History:  Diagnosis Date  . ADD (attention deficit disorder)   . Anemia    iron deficiency anemia-LAST HGB 11.8 ON 03-2016  . Arthritis    Rheumatoid arthritis  . Asthma    well-controlled-no inhalers  . GERD (gastroesophageal reflux disease)    occ- rolaids  . Headache   . History of Papanicolaou smear of cervix 04/16/2012   -/-  . Hyperlipemia   . Hypertension   . Pre-eclampsia   . Preeclampsia   . Vitamin D deficiency     Past Surgical History:  Procedure Laterality Date  . ABDOMINAL HYSTERECTOMY  07/04/2017  . DILATATION & CURETTAGE/HYSTEROSCOPY WITH MYOSURE N/A 03/27/2016   Procedure: [place] WITH MYOSURE, MYOMECTOMY;  Surgeon: Gae Dry, MD;  Location: [facility];  Service: Gynecology;  Laterality: N/A;  . DILITATION & CURRETTAGE/HYSTROSCOPY WITH NOVASURE ABLATION N/A 03/09/2015   Procedure: DILATATION & CURETTAGE/HYSTEROSCOPY WITH NOVASURE ABLATION;  Surgeon: Gae Dry, MD;  Location: [facility];  Service: Gynecology;  Laterality: N/A;  . LAPAROSCOPIC BILATERAL SALPINGECTOMY Right 03/06/2017   Procedure: LAPAROSCOPIC  SALPINGECTOMY WITH REMOVAL OF ANY ABNORMAL TISSUE NOTED;  Surgeon: Will Bonnet, MD;  Location: [facility];  Service:  Gynecology;  Laterality: Right;  . LAPAROSCOPIC OVARIAN CYSTECTOMY Right 03/06/2017   Procedure: LAPAROSCOPIC OVARIAN CYSTECTOMY;  Surgeon: Will Bonnet, MD;  Location: [facility];  Service: Gynecology;  Laterality: Right;  . LAPAROSCOPY N/A 03/06/2017   Procedure: LAPAROSCOPY OPERATIVE;  Surgeon: Will Bonnet, MD;  Location: [facility];  Service: Gynecology;  Laterality: N/A;    Family History  Problem Relation Age of Onset  . Hypertension Mother   . Other Mother        Respiratory disorder  . Hypertension Father   . Hyperlipidemia Father   . Lymphoma Father 6       non hodgkins   . Brain cancer Paternal Uncle   . Lung cancer Maternal Grandmother 68       not sure if tested, has communication  . Breast cancer Paternal Aunt 33       not sure, has communication  . Lung cancer Maternal Grandfather     Social History   Socioeconomic History  . Marital status: Married    Spouse name: Not on file  . Number of children: 1  . Years of education: 32  . Highest education level: Not on file  Occupational History  . Occupation: business    Comment: Surveyor, minerals  Social Needs  . Financial resource strain: Not on file  . Food insecurity:    Worry: Not on file    Inability: Not on file  . Transportation needs:    Medical: Not on file  Non-medical: Not on file  Tobacco Use  . Smoking status: Never Smoker  . Smokeless tobacco: Never Used  Substance and Sexual Activity  . Alcohol use: Not Currently  . Drug use: No  . Sexual activity: Yes    Birth control/protection: None, Surgical    Comment: Hysterectomy   Lifestyle  . Physical activity:    Days per week: 0 days    Minutes per session: 0 min  . Stress: Rather much  Relationships  . Social connections:    Talks on phone: Not on file    Gets together: Not on file    Attends religious service: Not on file    Active member of club or organization: Not on file    Attends meetings of clubs or organizations: Not  on file    Relationship status: Not on file  . Intimate partner violence:    Fear of current or ex partner: Not on file    Emotionally abused: Not on file    Physically abused: Not on file    Forced sexual activity: Not on file  Other Topics Concern  . Not on file  Social History Narrative  . Not on file    Outpatient Medications Prior to Visit  Medication Sig Dispense Refill  . fluticasone (FLONASE) 50 MCG/ACT nasal spray Place 2 sprays into both nostrils daily. 16 g 0  . ibuprofen (ADVIL,MOTRIN) 600 MG tablet Take 1 tablet (600 mg total) by mouth every 6 (six) hours as needed for mild pain or cramping. 30 tablet 0  . lisinopril (PRINIVIL,ZESTRIL) 20 MG tablet Take 1 tablet (20 mg total) by mouth daily. 90 tablet 3  . methotrexate 50 MG/2ML injection INJECT 0.6 ML SQ ONCE A WEEK  1  . methylPREDNISolone (MEDROL) 4 MG tablet Take 4 tablets (4 mg each) for 2 days, 3 tablets for 2 days, and 2 tablets every day until finished.    . Multiple Vitamin (MULTIVITAMIN WITH MINERALS) TABS tablet Take 1 tablet by mouth daily.    Marland Kitchen KEVZARA 200 MG/1.14ML SOSY     . benzonatate (TESSALON) 200 MG capsule Take one cap TID PRN cough 30 capsule 0  . chlorpheniramine-HYDROcodone (TUSSIONEX PENNKINETIC ER) 10-8 MG/5ML SUER Take 5 mLs by mouth 2 (two) times daily. 115 mL 0  . doxycycline (VIBRAMYCIN) 100 MG capsule Take 1 capsule (100 mg total) by mouth 2 (two) times daily. 14 capsule 0  . ORENCIA CLICKJECT 409 MG/ML SOAJ INJECT ONE pen SUBCUTANEOUSLY every week-ON WEDENSDAYS-PT INSTRUCTED BY RHEUMATOLOGIST TO STOP MED 2 WEEKS PRIOR TO SURGERY AND HOLD FOR 2 WEEKS AFTER SURGERY  6  . Tofacitinib Citrate 5 MG TABS Take by mouth.     No facility-administered medications prior to visit.       ROS:  Review of Systems  Constitutional: Negative for fever.  Gastrointestinal: Negative for blood in stool, constipation, diarrhea, nausea and vomiting.  Genitourinary: Positive for vaginal bleeding. Negative for  dyspareunia, dysuria, flank pain, frequency, hematuria, urgency, vaginal discharge and vaginal pain.  Musculoskeletal: Negative for back pain.  Skin: Negative for rash.   BREAST: No symptoms   OBJECTIVE:   Vitals:  BP 120/82   Pulse 81   Ht 5\' 4"  (1.626 m)   Wt 183 lb (83 kg)   LMP 06/18/2017 (Exact Date)   BMI 31.41 kg/m   Physical Exam Vitals signs reviewed.  Constitutional:      Appearance: She is well-developed.  Pulmonary:     Effort: Pulmonary effort is normal.  Genitourinary:    General: Normal vulva.     Pubic Area: No rash.      Labia:        Right: No rash, tenderness or lesion.        Left: No rash, tenderness or lesion.      Vagina: Vaginal discharge present. No erythema or tenderness.     Uterus: Absent. Not tender.      Adnexa: Right adnexa normal and left adnexa normal.       Right: No mass or tenderness.         Left: No mass or tenderness.    Musculoskeletal: Normal range of motion.  Neurological:     Mental Status: She is alert and oriented to person, place, and time.  Psychiatric:        Behavior: Behavior normal.        Thought Content: Thought content normal.     Results: Results for orders placed or performed in visit on 06/17/18 (from the past 24 hour(s))  POCT Wet Prep with KOH     Status: Abnormal   Collection Time: 06/17/18 11:34 AM  Result Value Ref Range   Trichomonas, UA Negative    Clue Cells Wet Prep HPF POC pos    Epithelial Wet Prep HPF POC     Yeast Wet Prep HPF POC pos    Bacteria Wet Prep HPF POC     RBC Wet Prep HPF POC     WBC Wet Prep HPF POC     KOH Prep POC Positive (A) Negative     Assessment/Plan: Bacterial vaginosis - Pos sx/wet prep. Rx solosec, coupon card. F/u pnr. Will RF if sx recur. Add probiotics - Plan: Secnidazole (SOLOSEC) 2 g PACK, POCT Wet Prep with KOH  Candidal vaginitis - Rx diflucan. F/u pnr.  - Plan: fluconazole (DIFLUCAN) 150 MG tablet, POCT Wet Prep with KOH    Meds ordered this encounter   Medications  . Secnidazole (SOLOSEC) 2 g PACK    Sig: Take 2 g by mouth once for 1 dose. Mix 2 g granules with yogurt or pudding for 1 dose    Dispense:  1 each    Refill:  0    Pt has coupon card    Order Specific Question:   Supervising Provider    Answer:   Gae Dry [[id number]]  . fluconazole (DIFLUCAN) 150 MG tablet    Sig: Take 1 tablet (150 mg total) by mouth once for 1 dose.    Dispense:  1 tablet    Refill:  0    Order Specific Question:   Supervising Provider    Answer:   Gae Dry [[id number]]      Return if symptoms worsen or fail to improve.  Arizbeth Cawthorn B. Abygail Galeno, PA-C 06/17/2018 11:36 AM

## 2018-06-17 NOTE — Patient Instructions (Signed)
I value your feedback and entrusting us with your care. If you get a [facility] patient survey, I would appreciate you taking the time to let us know about your experience today. Thank you! 

## 2018-06-18 ENCOUNTER — Other Ambulatory Visit: Payer: Self-pay | Admitting: Obstetrics and Gynecology

## 2018-06-18 ENCOUNTER — Telehealth: Payer: Self-pay

## 2018-06-18 MED ORDER — METRONIDAZOLE 500 MG PO TABS: 500.0000 mg | ORAL_TABLET | Freq: Two times a day (BID) | ORAL | 0 refills | Status: DC

## 2018-06-18 NOTE — Telephone Encounter (Signed)
Patient is returning missed call. Please advise 

## 2018-06-18 NOTE — Telephone Encounter (Signed)
Pls Ask pt if she wants cream or pills instead?

## 2018-06-18 NOTE — Progress Notes (Signed)
Rx flagyl for BV. Solosec issues with coupon card.

## 2018-06-18 NOTE — Telephone Encounter (Signed)
Pills

## 2018-06-18 NOTE — Telephone Encounter (Signed)
Pt did not get it, $75 with coupon card.

## 2018-06-18 NOTE — Telephone Encounter (Signed)
Flagyl eRxd.

## 2018-06-18 NOTE — Telephone Encounter (Signed)
Per [facility], called pt to ask if they were able to get Solosec using coupon card given by [facility], no answer, [facility].

## 2018-06-30 ENCOUNTER — Encounter: Payer: Self-pay | Admitting: Obstetrics and Gynecology

## 2018-07-01 ENCOUNTER — Other Ambulatory Visit: Payer: Self-pay | Admitting: Obstetrics and Gynecology

## 2018-07-01 MED ORDER — NYSTATIN 100000 UNIT/ML MT SUSP: 5.0000 mL | Freq: Four times a day (QID) | OROMUCOSAL | 0 refills | Status: AC

## 2018-07-01 NOTE — Progress Notes (Signed)
Rx nystatin for thrush.

## 2018-07-16 ENCOUNTER — Other Ambulatory Visit: Payer: Self-pay | Admitting: Obstetrics and Gynecology

## 2018-07-16 ENCOUNTER — Encounter: Payer: Self-pay | Admitting: Obstetrics and Gynecology

## 2018-07-16 MED ORDER — METRONIDAZOLE 500 MG PO TABS: 500.0000 mg | ORAL_TABLET | Freq: Two times a day (BID) | ORAL | 0 refills | Status: DC

## 2018-07-16 NOTE — Progress Notes (Signed)
Rx RF flagyl for recurrent BV. F/u prn sx

## 2018-08-11 ENCOUNTER — Encounter: Payer: Self-pay | Admitting: Obstetrics and Gynecology

## 2018-08-11 ENCOUNTER — Other Ambulatory Visit: Payer: Self-pay | Admitting: Obstetrics and Gynecology

## 2018-08-11 MED ORDER — TERCONAZOLE 0.4 % VA CREA: 1.0000 | TOPICAL_CREAM | Freq: Every day | VAGINAL | 0 refills | Status: AC

## 2018-08-11 MED ORDER — CLINDAMYCIN HCL 300 MG PO CAPS: 300.0000 mg | ORAL_CAPSULE | Freq: Two times a day (BID) | ORAL | 0 refills | Status: AC

## 2018-08-11 NOTE — Progress Notes (Signed)
Rx terazol and clindamycin for recurrent BV and yeast.

## 2018-10-26 ENCOUNTER — Encounter: Payer: Self-pay | Admitting: Obstetrics and Gynecology

## 2018-10-27 ENCOUNTER — Other Ambulatory Visit: Payer: Self-pay | Admitting: Obstetrics and Gynecology

## 2018-10-27 MED ORDER — METRONIDAZOLE 500 MG PO TABS: 500.0000 mg | ORAL_TABLET | Freq: Two times a day (BID) | ORAL | 0 refills | Status: AC

## 2018-10-27 NOTE — Progress Notes (Signed)
Rx RF flagyl for BV sx.

## 2018-11-04 ENCOUNTER — Other Ambulatory Visit: Payer: Self-pay | Admitting: Obstetrics and Gynecology

## 2018-11-04 DIAGNOSIS — I10 Essential (primary) hypertension: Secondary | ICD-10-CM

## 2018-11-04 NOTE — Telephone Encounter (Signed)
Please advise 

## 2018-11-19 ENCOUNTER — Ambulatory Visit
Admission: EM | Admit: 2018-11-19 | Discharge: 2018-11-19 | Disposition: A | Discharge status: Home / Self Care | Payer: 59 | Attending: Urgent Care | Admitting: Urgent Care

## 2018-11-19 ENCOUNTER — Other Ambulatory Visit: Payer: Self-pay

## 2018-11-19 ENCOUNTER — Encounter: Payer: Self-pay | Admitting: Emergency Medicine

## 2018-11-19 DIAGNOSIS — R059 Cough, unspecified: Secondary | ICD-10-CM

## 2018-11-19 DIAGNOSIS — R05 Cough: Secondary | ICD-10-CM | POA: Diagnosis not present

## 2018-11-19 DIAGNOSIS — J019 Acute sinusitis, unspecified: Secondary | ICD-10-CM

## 2018-11-19 DIAGNOSIS — R030 Elevated blood-pressure reading, without diagnosis of hypertension: Secondary | ICD-10-CM

## 2018-11-19 DIAGNOSIS — B9689 Other specified bacterial agents as the cause of diseases classified elsewhere: Secondary | ICD-10-CM

## 2018-11-19 DIAGNOSIS — Z7189 Other specified counseling: Secondary | ICD-10-CM | POA: Diagnosis not present

## 2018-11-19 MED ORDER — BENZONATATE 200 MG PO CAPS: 200.0000 mg | ORAL_CAPSULE | Freq: Three times a day (TID) | ORAL | 0 refills | Status: DC | PRN

## 2018-11-19 MED ORDER — DOXYCYCLINE HYCLATE 100 MG PO CAPS: 100.0000 mg | ORAL_CAPSULE | Freq: Two times a day (BID) | ORAL | 0 refills | Status: DC

## 2018-11-19 NOTE — Discharge Instructions (Addendum)
It was very nice seeing you today in clinic. Thank you for entrusting me with your care.   Please utilize the medications that we discussed. Your prescriptions have been called in to your pharmacy. May continue supportive medications at home as your are already doing.   REST. Increase fluid intake as much as possible. Water is always best, as sugar and caffeine containing fluids can cause you to become dehydrated. Try to incorporate electrolyte enriched fluids, such as Gatorade or Pedialyte, into your daily fluid intake.   You were swabbed for SARS-CoV-2 (novel coronavirus) today. Please quarantine at home until your negative results have been received.   Make arrangements to follow up with your regular doctor in 1 week for re-evaluation if not improving. If your symptoms/condition worsens, please seek follow up care either here or in the ER. Please remember, our [facility] providers are "right here with you" when you need [place].   Again, it was my pleasure to take care of you today. Thank you for choosing our clinic. I hope that you start to feel better quickly.   Honor Loh, MSN, APRN, FNP-C, CEN Advanced Practice Provider [facility]

## 2018-11-19 NOTE — ED Triage Notes (Signed)
Pt c/o sinus congestion, sinus pain and pressure in her ears and face. She can feel the mucus draining and going into her chest and is making her cough. Denies fever.

## 2018-11-19 NOTE — ED Provider Notes (Addendum)
[place], [place]   Name: ETHELL BLATCHFORD DOB: [birth year] MRN: [id number] CSN: [id number] PCP: Patient, No Pcp Per  Arrival date and time:  11/19/18 1716  Chief Complaint:  Nasal Congestion   NOTE: Prior to seeing the patient today, I have reviewed the triage nursing documentation and vital signs. Clinical staff has updated patient's PMH/PSHx, current medication list, and drug allergies/intolerances to ensure comprehensive history available to assist in medical decision making.   History:   HPI: Angelica Banks is a 38 y.o. female who presents today with complaints of cough and congestion that began on 11/09/2018 (10 days ago). Cough is reported to be non-productive and worse when supine. She reports low grade temperature (99 range), but no fevers. She is experiencing pain in her BILATERAL ears, a "scrathcy" throat, and paranasal sinus pain/tenderness. She denies any nausea, vomiting, or changes to her bowel habits. Patient is eating and drinking well. She denies known contact with anyone who is ill. Patient specifically denies contact with anyone known to have SARS-CoV-2 (novel coronavirus); she has never been tested. In efforts to conservatively manage her symptoms at home, the patient notes that she has used several over the counter interventions (fluticasone, cetirizine, guaifenesin, and Advil cold/sinus), none of which have helped to improve her symptoms. Patient presents to clinic today hypertensive at 149/103 (using decongestants).   Past Medical History:  Diagnosis Date  . ADD (attention deficit disorder)   . Anemia    iron deficiency anemia-LAST HGB 11.8 ON 03-2016  . Arthritis    Rheumatoid arthritis  . Asthma    well-controlled-no inhalers  . GERD (gastroesophageal reflux disease)    occ- rolaids  . Headache   . History of Papanicolaou smear of cervix 04/16/2012   -/-  . Hyperlipemia   . Hypertension   . Pre-eclampsia   . Preeclampsia   . Vitamin D deficiency     Past  Surgical History:  Procedure Laterality Date  . ABDOMINAL HYSTERECTOMY  07/04/2017  . DILATATION & CURETTAGE/HYSTEROSCOPY WITH MYOSURE N/A 03/27/2016   Procedure: [place] WITH MYOSURE, MYOMECTOMY;  Surgeon: Gae Dry, MD;  Location: [facility];  Service: Gynecology;  Laterality: N/A;  . DILITATION & CURRETTAGE/HYSTROSCOPY WITH NOVASURE ABLATION N/A 03/09/2015   Procedure: DILATATION & CURETTAGE/HYSTEROSCOPY WITH NOVASURE ABLATION;  Surgeon: Gae Dry, MD;  Location: [facility];  Service: Gynecology;  Laterality: N/A;  . LAPAROSCOPIC BILATERAL SALPINGECTOMY Right 03/06/2017   Procedure: LAPAROSCOPIC  SALPINGECTOMY WITH REMOVAL OF ANY ABNORMAL TISSUE NOTED;  Surgeon: Will Bonnet, MD;  Location: [facility];  Service: Gynecology;  Laterality: Right;  . LAPAROSCOPIC OVARIAN CYSTECTOMY Right 03/06/2017   Procedure: LAPAROSCOPIC OVARIAN CYSTECTOMY;  Surgeon: Will Bonnet, MD;  Location: [facility];  Service: Gynecology;  Laterality: Right;  . LAPAROSCOPY N/A 03/06/2017   Procedure: LAPAROSCOPY OPERATIVE;  Surgeon: Will Bonnet, MD;  Location: [facility];  Service: Gynecology;  Laterality: N/A;    Family History  Problem Relation Age of Onset  . Hypertension Mother   . Other Mother        Respiratory disorder  . Hypertension Father   . Hyperlipidemia Father   . Lymphoma Father 72       non hodgkins   . Brain cancer Paternal Uncle   . Lung cancer Maternal Grandmother 68       not sure if tested, has communication  . Breast cancer Paternal Aunt 44       not sure, has communication  . Lung cancer  Maternal Grandfather     Social History   Tobacco Use  . Smoking status: Never Smoker  . Smokeless tobacco: Never Used  Substance Use Topics  . Alcohol use: Not Currently  . Drug use: No    Patient Active Problem List   Diagnosis Date Noted  . Chronic pelvic pain in female 03/12/2017  . Endometriosis determined by laparoscopy 03/06/2017  .  Endometrioma 01/27/2017  . Hydrosalpinx 01/27/2017  . Dysmenorrhea 12/24/2016  . ADD (attention deficit disorder) 07/18/2016  . Hypertension 07/18/2016  . Hyperlipidemia 07/17/2016  . Vitamin D deficiency 07/17/2016  . Menorrhagia 03/09/2015  . JRA (juvenile rheumatoid arthritis) ([place]) 10/28/2013  . Migraines 10/28/2013  . Rheumatoid arthritis with rheumatoid factor ([place]) 10/28/2013  . Spina bifida ([place]) 10/28/2013    Home Medications:    Current Meds  Medication Sig  . KEVZARA 200 MG/1.14ML SOSY   . lisinopril (PRINIVIL,ZESTRIL) 20 MG tablet Take 1 tablet (20 mg total) by mouth daily.  . Multiple Vitamin (MULTIVITAMIN WITH MINERALS) TABS tablet Take 1 tablet by mouth daily.    Allergies:   Cefaclor, Penicillins, Prednisone, Sulfa antibiotics, and Sulfasalazine  Review of Systems (ROS): Review of Systems  Constitutional: Negative for fatigue and fever.  HENT: Positive for congestion, ear pain, rhinorrhea, sinus pressure, sinus pain and sore throat. Negative for ear discharge, postnasal drip, sneezing and trouble swallowing.   Eyes: Negative for pain, discharge and redness.  Respiratory: Positive for cough. Negative for chest tightness, shortness of breath and wheezing.   Cardiovascular: Negative for chest pain and palpitations.  Gastrointestinal: Negative for abdominal pain, diarrhea, nausea and vomiting.  Musculoskeletal: Positive for myalgias. Negative for arthralgias, back pain and neck pain.  Skin: Negative for color change, pallor and rash.  Neurological: Negative for dizziness, syncope, weakness and headaches.  Hematological: Negative for adenopathy.     Vital Signs: Today's Vitals   11/19/18 1731 11/19/18 1734 11/19/18 1756  BP:  (!) 149/103   Pulse:  75   Resp:  18   Temp:  98.9 F (37.2 C)   TempSrc:  Oral   SpO2:  95%   Weight: 175 lb (79.4 kg)    Height: 5\' 4"  (1.626 m)    PainSc: 5   5     Physical Exam: Physical Exam  Constitutional: She is  oriented to person, place, and time and well-developed, well-nourished, and in no distress.  Non-toxic appearance. She has a sickly appearance (acutely ill appearing). No distress.  HENT:  Head: Normocephalic and atraumatic.  Right Ear: Hearing, tympanic membrane, external ear and ear canal normal.  Left Ear: Hearing, tympanic membrane, external ear and ear canal normal.  Nose: Mucosal edema and rhinorrhea present. No epistaxis. Right sinus exhibits maxillary sinus tenderness and frontal sinus tenderness. Left sinus exhibits maxillary sinus tenderness and frontal sinus tenderness.  Mouth/Throat: Uvula is midline and mucous membranes are normal. Posterior oropharyngeal erythema present. No oropharyngeal exudate or posterior oropharyngeal edema.  Eyes: Pupils are equal, round, and reactive to light. Conjunctivae and EOM are normal.  Neck: Normal range of motion. Neck supple. No tracheal deviation present.  Cardiovascular: Normal rate, regular rhythm, normal heart sounds and intact distal pulses. Exam reveals no gallop and no friction rub.  No murmur heard. Pulmonary/Chest: Effort normal and breath sounds normal. No respiratory distress. She has no wheezes. She has no rales.  Abdominal: Soft. Bowel sounds are normal. She exhibits no distension. There is no abdominal tenderness.  Musculoskeletal: Normal range of motion.  Lymphadenopathy:  Head (right side): Submandibular adenopathy present.       Head (left side): Submandibular adenopathy present.  Neurological: She is alert and oriented to person, place, and time. Gait normal. GCS score is 15.  Skin: Skin is warm and dry. No rash noted. She is not diaphoretic.  Psychiatric: Mood, memory, affect and judgment normal.  Nursing note and vitals reviewed.   Urgent Care Treatments / Results:   LABS: PLEASE NOTE: all labs that were ordered this encounter are listed, however only abnormal results are displayed. Labs Reviewed  NOVEL CORONAVIRUS,  NAA (HOSPITAL ORDER, SEND-OUT TO REF LAB)    EKG: -None  RADIOLOGY: No results found.  PROCEDURES: Procedures  MEDICATIONS RECEIVED THIS VISIT: Medications - No data to display  PERTINENT CLINICAL COURSE NOTES/UPDATES:   Initial Impression / Assessment and Plan / Urgent Care Course:  Pertinent labs & imaging results that were available during my care of the patient were personally reviewed by me and considered in my medical decision making (see lab/imaging section of note for values and interpretations).  Angelica Banks is a 38 y.o. female who presents to [facility] today with complaints of cough and congestion.   Patient is acutely ill appearing overall in clinic today. She does not appear to be in any acute distress. Presenting symptoms (see HPI) and exam as documented above. Presenting symptoms felt to be consistent with acute bacterial rhinosinusitis. Symptom constellation not responsive to conservative management. She notes progressive worsening. Discussed typical symptom constellation associated with SARS-CoV-2 (novel coronavirus) and need for testing; patient in agreement. Patient collected SARS-CoV-2 swab via facility approved self collection process today under the supervision of certified clinical staff. Discussed variable turn around times associated with testing, as swabs are being processed at [facility], and have been taking as long as 7 days. She was advised to self quarantine, per [facility] DHHS guidelines, until negative results received.   Given progressive symptoms despite conservative management, will proceed with antimicrobial therapy. She is allergic to PCN and Sulfa drugs. Will cover with a 10 day course of doxycycline. Dicussed supportive care measures at home during acute phase of illness. Patient to rest as much as possible. She was encouraged to ensure adequate hydration (water and ORS) to prevent dehydration and electrolyte derangements. Patient may use APAP and/or  IBU on an as needed basis for pain/fever. Will also provide a prescription for benzonatate for PRN use to help with her cough. Patient may continue fluticasone and guaifenesin as needed. She was advised to avoid further use of decongestants as they are known to be associated with transient elevations in blood pressure. She presents today with an elevated blood pressure reading; does not have an HTN diagnosis. Recommended Coricidin HBP products, however patient states, "I never use those because they don't work".   Current clinical condition warrants patient being out of work in order to recover from her current injury/illness. She was provided with the appropriate documentation to provide to her place of employment that will allow for her to RTW on 11/22/2018 with no restrictions. RTW is contingent on her SARS-CoV-2 test results being reviewed as negative.    Discussed follow up with primary care physician in 1 week for re-evaluation. I have reviewed the follow up and strict return precautions for any new or worsening symptoms. Patient is aware of symptoms that would be deemed urgent/emergent, and would thus require further evaluation either here or in the emergency department. At the time of discharge, she verbalized understanding and consent  with the discharge plan as it was reviewed with her. All questions were fielded by provider and/or clinic staff prior to patient discharge.    Final Clinical Impressions / Urgent Care Diagnoses:   Final diagnoses:  Acute bacterial rhinosinusitis  Educated About Covid-19 Virus Infection  Elevated BP without diagnosis of hypertension  Cough    New Prescriptions:  [place] Controlled Substance Registry consulted? Not Applicable  Meds ordered this encounter  Medications  . doxycycline (VIBRAMYCIN) 100 MG capsule    Sig: Take 1 capsule (100 mg total) by mouth 2 (two) times daily.    Dispense:  20 capsule    Refill:  0  . benzonatate (TESSALON) 200 MG capsule     Sig: Take 1 capsule (200 mg total) by mouth 3 (three) times daily as needed for cough.    Dispense:  21 capsule    Refill:  0    Recommended Follow up Care:  Patient encouraged to follow up with the following provider within the specified time frame, or sooner as dictated by the severity of her symptoms. As always, she was instructed that for any urgent/emergent care needs, she should seek care either here or in the emergency department for more immediate evaluation.  Follow-up Information    PCP In 1 week.   Why: General reassessment of symptoms if not improving        NOTE: This note was prepared using Lobbyist along with smaller Company secretary. Despite my best ability to proofread, there is the potential that transcriptional errors may still occur from this process, and are completely unintentional.    Karen Kitchens, NP 11/20/18 1941

## 2018-11-21 LAB — NOVEL CORONAVIRUS, NAA (HOSP ORDER, SEND-OUT TO REF LAB; TAT 18-24 HRS): SARS-CoV-2, NAA: NOT DETECTED

## 2018-11-23 ENCOUNTER — Encounter (HOSPITAL_COMMUNITY): Payer: Self-pay

## 2018-11-23 NOTE — Telephone Encounter (Signed)
Received PA for Solosec 2 GM packets from pharmacy. Called pt to ask her of this request, says she picked up Rx on 7/21 and mentioned that she got call from pharmacy about this solosec and told them she did not need it.

## 2018-12-02 ENCOUNTER — Other Ambulatory Visit: Payer: Self-pay | Admitting: Obstetrics and Gynecology

## 2018-12-02 ENCOUNTER — Telehealth: Payer: Self-pay

## 2018-12-02 DIAGNOSIS — I10 Essential (primary) hypertension: Secondary | ICD-10-CM

## 2018-12-02 MED ORDER — LISINOPRIL 20 MG PO TABS: 20.0000 mg | ORAL_TABLET | Freq: Every day | ORAL | 0 refills | Status: DC

## 2018-12-02 NOTE — Telephone Encounter (Signed)
Patient is scheduled for AE w/[facility] on 12/29/2018. She is requesting refill of Lisinopril to get her to her apt.

## 2018-12-02 NOTE — Progress Notes (Signed)
Rx RF lisinopril until 9/20 annual

## 2018-12-02 NOTE — Telephone Encounter (Signed)
Rx RF eRxd.  

## 2018-12-29 ENCOUNTER — Ambulatory Visit: Payer: 59 | Admitting: Obstetrics and Gynecology

## 2019-01-12 ENCOUNTER — Other Ambulatory Visit: Payer: Self-pay

## 2019-01-12 ENCOUNTER — Ambulatory Visit (INDEPENDENT_AMBULATORY_CARE_PROVIDER_SITE_OTHER): Payer: 59 | Admitting: Obstetrics and Gynecology

## 2019-01-12 ENCOUNTER — Encounter: Payer: Self-pay | Admitting: Obstetrics and Gynecology

## 2019-01-12 VITALS — BP 120/80 | Ht 64.0 in | Wt 188.0 lb

## 2019-01-12 DIAGNOSIS — Z01419 Encounter for gynecological examination (general) (routine) without abnormal findings: Secondary | ICD-10-CM | POA: Diagnosis not present

## 2019-01-12 DIAGNOSIS — I10 Essential (primary) hypertension: Secondary | ICD-10-CM

## 2019-01-12 MED ORDER — LISINOPRIL 20 MG PO TABS: 20.0000 mg | ORAL_TABLET | Freq: Every day | ORAL | 3 refills | Status: DC

## 2019-01-12 NOTE — Patient Instructions (Signed)
I value your feedback and entrusting us with your care. If you get a [place] patient survey, I would appreciate you taking the time to let us know about your experience today. Thank you! 

## 2019-01-12 NOTE — Progress Notes (Signed)
Chief Complaint  Patient presents with  . Gynecologic Exam     HPI:      [name] is a 38 y.o. G1P1001 who LMP was Patient's last menstrual period was 06/18/2017 (exact date)., presents today for her annual examination.  Her menses are absent due to TAH 3/19 at [facility] due to endometriosis. Had colon resection at that time due to endometriosis as well.  Sx much improved and pt feeling better.    Sex activity: single partner, contraception -hyst.  Hx of recurrent BV, last treated 7/20. Sx triggered by prednisone use with rheumatology. Taking probiotics, has tried boric acid supp in past.   Last Pap: May 30, 2015  Results were: no abnormalities /neg HPV DNA . No longer indicated.  There is a FH of breast cancer in her pat aunt, genetic testing not indicated. There is no FH of ovarian cancer. The patient does do self-breast exams.  Tobacco use: The patient denies current or previous tobacco use. Alcohol use: social drinker No drug use. Exercise: very active  She does get adequate calcium and Vitamin D in her diet.   We are managing her HTN. She is on lisinopril 20 mg daily, changed from methyldopa in past since no longer controlling her BP. No side effects/ACEI cough.   Past Medical History:  Diagnosis Date  . ADD (attention deficit disorder)   . Anemia    iron deficiency anemia-LAST HGB 11.8 ON 03-2016  . Arthritis    Rheumatoid arthritis  . Asthma    well-controlled-no inhalers  . GERD (gastroesophageal reflux disease)    occ- rolaids  . Headache   . History of Papanicolaou smear of cervix 04/16/2012   -/-  . Hyperlipemia   . Hypertension   . Pre-eclampsia   . Preeclampsia   . Vitamin D deficiency     Past Surgical History:  Procedure Laterality Date  . ABDOMINAL HYSTERECTOMY  07/04/2017  . DILATATION & CURETTAGE/HYSTEROSCOPY WITH MYOSURE N/A 03/27/2016   Procedure: [place] WITH MYOSURE, MYOMECTOMY;  Surgeon: Gae Dry, MD;  Location: [facility];  Service: Gynecology;  Laterality: N/A;  . DILITATION & CURRETTAGE/HYSTROSCOPY WITH NOVASURE ABLATION N/A 03/09/2015   Procedure: DILATATION & CURETTAGE/HYSTEROSCOPY WITH NOVASURE ABLATION;  Surgeon: Gae Dry, MD;  Location: [facility];  Service: Gynecology;  Laterality: N/A;  . LAPAROSCOPIC BILATERAL SALPINGECTOMY Right 03/06/2017   Procedure: LAPAROSCOPIC  SALPINGECTOMY WITH REMOVAL OF ANY ABNORMAL TISSUE NOTED;  Surgeon: Will Bonnet, MD;  Location: [facility]S;  Service: Gynecology;  Laterality: Right;  . LAPAROSCOPIC OVARIAN CYSTECTOMY Right 03/06/2017   Procedure: LAPAROSCOPIC OVARIAN CYSTECTOMY;  Surgeon: Will Bonnet, MD;  Location: [facility];  Service: Gynecology;  Laterality: Right;  . LAPAROSCOPY N/A 03/06/2017   Procedure: LAPAROSCOPY OPERATIVE;  Surgeon: Will Bonnet, MD;  Location: [facility];  Service: Gynecology;  Laterality: N/A;    Family History  Problem Relation Age of Onset  . Hypertension Mother   . Other Mother        Respiratory disorder  . Hypertension Father   . Hyperlipidemia Father   . Lymphoma Father 19       non hodgkins   . Brain cancer Paternal Uncle   . Lung cancer Maternal Grandmother 68       not sure if tested, has communication  . Breast cancer Paternal Aunt 24       not sure, has communication  . Lung cancer Maternal Grandfather  Social History   Socioeconomic History  . Marital status: Married    Spouse name: Not on file  . Number of children: 1  . Years of education: 61  . Highest education level: Not on file  Occupational History  . Occupation: business    Comment: Surveyor, minerals  Social Needs  . Financial resource strain: Not on file  . Food insecurity    Worry: Not on file    Inability: Not on file  . Transportation needs    Medical: Not on file    Non-medical: Not on file  Tobacco Use  . Smoking status: Never Smoker  . Smokeless tobacco: Never Used  Substance and Sexual  Activity  . Alcohol use: Not Currently  . Drug use: No  . Sexual activity: Yes    Birth control/protection: None, Surgical    Comment: Hysterectomy   Lifestyle  . Physical activity    Days per week: 0 days    Minutes per session: 0 min  . Stress: Rather much  Relationships  . Social Herbalist on phone: Not on file    Gets together: Not on file    Attends religious service: Not on file    Active member of club or organization: Not on file    Attends meetings of clubs or organizations: Not on file    Relationship status: Not on file  . Intimate partner violence    Fear of current or ex partner: Not on file    Emotionally abused: Not on file    Physically abused: Not on file    Forced sexual activity: Not on file  Other Topics Concern  . Not on file  Social History Narrative  . Not on file    Current Outpatient Medications on File Prior to Visit  Medication Sig Dispense Refill  . fluticasone (FLONASE) 50 MCG/ACT nasal spray Place 2 sprays into both nostrils daily. 16 g 0  . ibuprofen (ADVIL,MOTRIN) 600 MG tablet Take 1 tablet (600 mg total) by mouth every 6 (six) hours as needed for mild pain or cramping. 30 tablet 0  . INSULIN SYRINGE 1CC/29G (EXEL COMFORT POINT INSULIN SYR) 29G X 1/2" 1 ML MISC Use as directed    . KEVZARA 200 MG/1.14ML SOSY     . methotrexate 50 MG/2ML injection INJECT 0.6 ML SQ ONCE A WEEK  1  . Multiple Vitamin (MULTIVITAMIN WITH MINERALS) TABS tablet Take 1 tablet by mouth daily.     No current facility-administered medications on file prior to visit.      ROS:  Review of Systems  Constitutional: Negative for fatigue, fever and unexpected weight change.  Respiratory: Negative for cough, shortness of breath and wheezing.   Cardiovascular: Negative for chest pain, palpitations and leg swelling.  Gastrointestinal: Negative for blood in stool, constipation, diarrhea, nausea and vomiting.  Endocrine: Negative for cold intolerance, heat  intolerance and polyuria.  Genitourinary: Negative for dyspareunia, dysuria, flank pain, frequency, genital sores, hematuria, menstrual problem, pelvic pain, urgency, vaginal bleeding, vaginal discharge and vaginal pain.  Musculoskeletal: Negative for back pain, joint swelling and myalgias.  Skin: Negative for rash.  Neurological: Negative for dizziness, syncope, light-headedness, numbness and headaches.  Hematological: Negative for adenopathy.  Psychiatric/Behavioral: Negative for agitation, confusion, sleep disturbance and suicidal ideas. The patient is not nervous/anxious.      Objective: BP 120/80   Ht 5\' 4"  (1.626 m)   Wt 188 lb (85.3 kg)   LMP 06/18/2017 (Exact Date)  BMI 32.27 kg/m    Physical Exam Constitutional:      Appearance: She is well-developed.  Genitourinary:     Vulva, vagina, right adnexa and left adnexa normal.     No vaginal discharge, erythema or tenderness.     Cervix is absent.     Uterus is absent.     No right or left adnexal mass present.     Right adnexa not tender.     Left adnexa not tender.     Genitourinary Comments: UTERUS/CX SURG REM  Neck:     Musculoskeletal: Normal range of motion.     Thyroid: No thyromegaly.  Cardiovascular:     Rate and Rhythm: Normal rate and regular rhythm.     Heart sounds: Normal heart sounds. No murmur.  Pulmonary:     Effort: Pulmonary effort is normal.     Breath sounds: Normal breath sounds.  Chest:     Breasts:        Right: No mass, nipple discharge, skin change or tenderness.        Left: No mass, nipple discharge, skin change or tenderness.  Abdominal:     Palpations: Abdomen is soft.     Tenderness: There is no abdominal tenderness. There is no guarding.  Musculoskeletal: Normal range of motion.  Neurological:     General: No focal deficit present.     Mental Status: She is alert and oriented to person, place, and time.     Cranial Nerves: No cranial nerve deficit.  Skin:    General: Skin is  warm and dry.  Psychiatric:        Mood and Affect: Mood normal.        Behavior: Behavior normal.        Thought Content: Thought content normal.        Judgment: Judgment normal.  Vitals signs reviewed.     Assessment/Plan: Encounter for annual routine gynecological examination  Essential hypertension - Controlled with lisinopril 20 mg daily. Rx RF. F/u if BP > 140/90. - Plan: lisinopril (ZESTRIL) 20 MG tablet  Meds ordered this encounter  Medications  . lisinopril (ZESTRIL) 20 MG tablet    Sig: Take 1 tablet (20 mg total) by mouth daily.    Dispense:  90 tablet    Refill:  3    Order Specific Question:   Supervising Provider    Answer:   Gae Dry [id number]             GYN counsel menopause, adequate intake of calcium and vitamin D, diet and exercise     F/U  Return in about 1 year (around 01/12/2020).  Alicia B. Copland, PA-C 01/12/2019 1:59 PM

## 2019-08-30 ENCOUNTER — Other Ambulatory Visit: Payer: Self-pay | Admitting: Rheumatology

## 2019-08-30 DIAGNOSIS — G8929 Other chronic pain: Secondary | ICD-10-CM

## 2019-08-30 DIAGNOSIS — M25511 Pain in right shoulder: Secondary | ICD-10-CM

## 2019-08-30 DIAGNOSIS — M0579 Rheumatoid arthritis with rheumatoid factor of multiple sites without organ or systems involvement: Secondary | ICD-10-CM

## 2019-09-14 ENCOUNTER — Ambulatory Visit
Admission: RE | Admit: 2019-09-14 | Discharge: 2019-09-14 | Disposition: A | Discharge status: Home / Self Care | Payer: 59 | Source: Ambulatory Visit | Attending: Rheumatology | Admitting: Rheumatology

## 2019-09-14 ENCOUNTER — Other Ambulatory Visit: Payer: Self-pay

## 2019-09-14 DIAGNOSIS — M0579 Rheumatoid arthritis with rheumatoid factor of multiple sites without organ or systems involvement: Secondary | ICD-10-CM | POA: Diagnosis present

## 2019-09-14 DIAGNOSIS — M25511 Pain in right shoulder: Secondary | ICD-10-CM | POA: Diagnosis present

## 2019-09-14 DIAGNOSIS — G8929 Other chronic pain: Secondary | ICD-10-CM | POA: Diagnosis present

## 2019-11-16 ENCOUNTER — Other Ambulatory Visit: Payer: Self-pay | Admitting: Surgery

## 2019-11-24 ENCOUNTER — Encounter
Admission: RE | Admit: 2019-11-24 | Discharge: 2019-11-24 | Disposition: A | Discharge status: Home / Self Care | Payer: 59 | Source: Ambulatory Visit | Attending: Surgery | Admitting: Surgery

## 2019-11-24 ENCOUNTER — Other Ambulatory Visit: Payer: Self-pay

## 2019-11-24 ENCOUNTER — Other Ambulatory Visit: Payer: 59

## 2019-11-24 DIAGNOSIS — Z01818 Encounter for other preprocedural examination: Secondary | ICD-10-CM | POA: Insufficient documentation

## 2019-11-24 NOTE — Patient Instructions (Signed)
Your procedure is scheduled BT:[facility] 8/24 Report to Day Surgery. To find out your arrival time please call [phone] between 1PM - 3PM on Mon 8/23.  Remember: Instructions that are not followed completely may result in serious medical risk,  up to and including death, or upon the discretion of your surgeon and anesthesiologist your  surgery may need to be rescheduled.     _X__ 1. Do not eat food after midnight the night before your procedure.                 No chewing gum or hard candies. You may drink clear liquids up to 2 hours                 before you are scheduled to arrive for your surgery- DO not drink clear                 liquids within 2 hours of the start of your surgery.                 Clear Liquids include:  water, apple juice without pulp, clear Gatorade, G2 or                  Gatorade Zero (avoid Red/Purple/Blue), Black Coffee or Tea (Do not add                 anything to coffee or tea). __x___2.   Complete the "Ensure Clear Pre-surgery Clear Carbohydrate Drink" provided to you, 2 hours before arrival. **If you       are diabetic you will be provided with an alternative drink, Gatorade Zero or G2.  __X__2.  On the morning of surgery brush your teeth with toothpaste and water, you                may rinse your mouth with mouthwash if you wish.  Do not swallow any toothpaste of mouthwash.     _X__ 3.  No Alcohol for 24 hours before or after surgery.   ___ 4.  Do Not Smoke or use e-cigarettes For 24 Hours Prior to Your Surgery.                 Do not use any chewable tobacco products for at least 6 hours prior to                 Surgery.  ___  5.  Do not use any recreational drugs (marijuana, cocaine, heroin, ecstasy, MDMA or other)                For at least one week prior to your surgery.  Combination of these drugs with anesthesia                May have life threatening results.  ____  6.  Bring all medications with you on the day of  surgery if instructed.   __x__  7.  Notify your doctor if there is any change in your medical condition      (cold, fever, infections).     Do not wear jewelry, make-up, hairpins, clips or nail polish. Do not wear lotions, powders, or perfumes. . Do not shave 48 hours prior to surgery. Do not bring valuables to the hospital.    [facility] is not responsible for any belongings or valuables.  Contacts, dentures or bridgework may not be worn into surgery. Leave your suitcase in the car. After surgery it may be brought  to your room. For patients admitted to the hospital, discharge time is determined by your treatment team.   Patients discharged the day of surgery will not be allowed to drive home.   Make arrangements for someone to be with you for the first 24 hours of your Same Day Discharge.    Please read over the following fact sheets that you were given:    __x__ Take these medicines the morning of surgery with A SIP OF WATER:    1. fluticasone (FLONASE) 50 MCG/ACT nasal spray if needed  2. Tylenol if needed  3.   4.  5.  6.  ____ Fleet Enema (as directed)   __x__ Use CHG Soap (or wipes) as directed  ____ Use Benzoyl Peroxide Gel as instructed  ____ Use inhalers on the day of surgery  ____ Stop metformin 2 days prior to surgery    ____ Take 1/2 of usual insulin dose the night before surgery. No insulin the morning          of surgery.   ____ Stop Coumadin/Plavix/aspirin on   _x_ Stop Anti-inflammatories ibuprofen (ADVIL,MOTRIN) 600 MG tablet   May take tylenol   ____ Stop supplements until after surgery.    ____ Bring C-Pap to the hospital.   Last dose of KEVZARA 200 MG/1.14ML SOSY and methotrexate 50 MG/2ML injection was on 8/10.  Will resume 2 weeks after surgery  If you have any questions regarding your pre-procedure instructions,  Please call Pre-admit Testing at [phone]

## 2019-11-26 ENCOUNTER — Other Ambulatory Visit
Admission: RE | Admit: 2019-11-26 | Discharge: 2019-11-26 | Disposition: A | Discharge status: Home / Self Care | Payer: 59 | Source: Ambulatory Visit | Attending: Surgery | Admitting: Surgery

## 2019-11-26 ENCOUNTER — Other Ambulatory Visit: Payer: 59

## 2019-11-26 ENCOUNTER — Other Ambulatory Visit: Payer: Self-pay

## 2019-11-26 DIAGNOSIS — Z01818 Encounter for other preprocedural examination: Secondary | ICD-10-CM | POA: Diagnosis present

## 2019-11-26 DIAGNOSIS — Z20822 Contact with and (suspected) exposure to covid-19: Secondary | ICD-10-CM | POA: Insufficient documentation

## 2019-11-26 DIAGNOSIS — I10 Essential (primary) hypertension: Secondary | ICD-10-CM | POA: Insufficient documentation

## 2019-11-27 LAB — SARS CORONAVIRUS 2 (TAT 6-24 HRS): SARS Coronavirus 2: NEGATIVE

## 2019-11-30 ENCOUNTER — Ambulatory Visit: Payer: 59 | Admitting: Anesthesiology

## 2019-11-30 ENCOUNTER — Ambulatory Visit
Admission: RE | Admit: 2019-11-30 | Discharge: 2019-11-30 | Disposition: A | Discharge status: Home / Self Care | Payer: 59 | Attending: Surgery | Admitting: Surgery

## 2019-11-30 ENCOUNTER — Other Ambulatory Visit: Payer: Self-pay

## 2019-11-30 ENCOUNTER — Encounter
Admission: RE | Disposition: A | Discharge status: Home / Self Care | Payer: Self-pay | Source: Home / Self Care | Attending: Surgery

## 2019-11-30 ENCOUNTER — Encounter: Payer: Self-pay | Admitting: Surgery

## 2019-11-30 ENCOUNTER — Ambulatory Visit: Payer: 59

## 2019-11-30 DIAGNOSIS — F988 Other specified behavioral and emotional disorders with onset usually occurring in childhood and adolescence: Secondary | ICD-10-CM | POA: Diagnosis not present

## 2019-11-30 DIAGNOSIS — M7541 Impingement syndrome of right shoulder: Secondary | ICD-10-CM | POA: Diagnosis not present

## 2019-11-30 DIAGNOSIS — G43909 Migraine, unspecified, not intractable, without status migrainosus: Secondary | ICD-10-CM | POA: Diagnosis not present

## 2019-11-30 DIAGNOSIS — J45909 Unspecified asthma, uncomplicated: Secondary | ICD-10-CM | POA: Diagnosis not present

## 2019-11-30 DIAGNOSIS — Z888 Allergy status to other drugs, medicaments and biological substances status: Secondary | ICD-10-CM | POA: Diagnosis not present

## 2019-11-30 DIAGNOSIS — M75111 Incomplete rotator cuff tear or rupture of right shoulder, not specified as traumatic: Secondary | ICD-10-CM | POA: Diagnosis not present

## 2019-11-30 DIAGNOSIS — Q059 Spina bifida, unspecified: Secondary | ICD-10-CM | POA: Insufficient documentation

## 2019-11-30 DIAGNOSIS — Z880 Allergy status to penicillin: Secondary | ICD-10-CM | POA: Insufficient documentation

## 2019-11-30 DIAGNOSIS — M7581 Other shoulder lesions, right shoulder: Secondary | ICD-10-CM | POA: Insufficient documentation

## 2019-11-30 DIAGNOSIS — E785 Hyperlipidemia, unspecified: Secondary | ICD-10-CM | POA: Diagnosis not present

## 2019-11-30 DIAGNOSIS — Z882 Allergy status to sulfonamides status: Secondary | ICD-10-CM | POA: Diagnosis not present

## 2019-11-30 DIAGNOSIS — I10 Essential (primary) hypertension: Secondary | ICD-10-CM | POA: Insufficient documentation

## 2019-11-30 DIAGNOSIS — M069 Rheumatoid arthritis, unspecified: Secondary | ICD-10-CM | POA: Insufficient documentation

## 2019-11-30 DIAGNOSIS — E559 Vitamin D deficiency, unspecified: Secondary | ICD-10-CM | POA: Insufficient documentation

## 2019-11-30 DIAGNOSIS — Z79899 Other long term (current) drug therapy: Secondary | ICD-10-CM | POA: Insufficient documentation

## 2019-11-30 DIAGNOSIS — D649 Anemia, unspecified: Secondary | ICD-10-CM | POA: Insufficient documentation

## 2019-11-30 DIAGNOSIS — M25511 Pain in right shoulder: Secondary | ICD-10-CM

## 2019-11-30 DIAGNOSIS — K219 Gastro-esophageal reflux disease without esophagitis: Secondary | ICD-10-CM | POA: Diagnosis not present

## 2019-11-30 DIAGNOSIS — G8929 Other chronic pain: Secondary | ICD-10-CM

## 2019-11-30 HISTORY — PX: SHOULDER ARTHROSCOPY WITH OPEN ROTATOR CUFF REPAIR: SHX6092

## 2019-11-30 SURGERY — ARTHROSCOPY, SHOULDER WITH REPAIR, ROTATOR CUFF, OPEN
Anesthesia: General | Site: Shoulder | Laterality: Right

## 2019-11-30 MED ORDER — TRAMADOL HCL 50 MG PO TABS: 50.0000 mg | ORAL_TABLET | Freq: Four times a day (QID) | ORAL | 0 refills | Status: AC | PRN

## 2019-11-30 MED ORDER — BUPIVACAINE-EPINEPHRINE (PF) 0.5% -1:200000 IJ SOLN
INTRAMUSCULAR | Status: AC
  Filled 2019-11-30: qty 30

## 2019-11-30 MED ORDER — PHENYLEPHRINE HCL-NACL 10-0.9 MG/250ML-% IV SOLN
INTRAVENOUS | Status: DC | PRN
  Administered 2019-11-30: 50 ug/min via INTRAVENOUS

## 2019-11-30 MED ORDER — ONDANSETRON HCL 4 MG PO TABS: 4.0000 mg | ORAL_TABLET | Freq: Four times a day (QID) | ORAL | Status: DC | PRN

## 2019-11-30 MED ORDER — EPHEDRINE SULFATE 50 MG/ML IJ SOLN
INTRAMUSCULAR | Status: DC | PRN
  Administered 2019-11-30: 5 mg via INTRAVENOUS

## 2019-11-30 MED ORDER — MIDAZOLAM HCL 2 MG/2ML IJ SOLN: 1.0000 mg | Freq: Once | INTRAMUSCULAR | Status: AC

## 2019-11-30 MED ORDER — ONDANSETRON HCL 4 MG/2ML IJ SOLN
INTRAMUSCULAR | Status: AC
  Administered 2019-11-30: 4 mg via INTRAVENOUS
  Filled 2019-11-30: qty 2

## 2019-11-30 MED ORDER — BUPIVACAINE LIPOSOME 1.3 % IJ SUSP
INTRAMUSCULAR | Status: DC | PRN
  Administered 2019-11-30: 20 mL

## 2019-11-30 MED ORDER — FAMOTIDINE 20 MG PO TABS
ORAL_TABLET | ORAL | Status: AC
  Administered 2019-11-30: 20 mg via ORAL
  Filled 2019-11-30: qty 1

## 2019-11-30 MED ORDER — PROPOFOL 10 MG/ML IV BOLUS
INTRAVENOUS | Status: AC
  Filled 2019-11-30: qty 20

## 2019-11-30 MED ORDER — CLINDAMYCIN PHOSPHATE 900 MG/50ML IV SOLN
900.0000 mg | INTRAVENOUS | Status: AC
  Administered 2019-11-30: 900 mg via INTRAVENOUS

## 2019-11-30 MED ORDER — PROPOFOL 10 MG/ML IV BOLUS
INTRAVENOUS | Status: DC | PRN
  Administered 2019-11-30: 150 mg via INTRAVENOUS

## 2019-11-30 MED ORDER — CHLORHEXIDINE GLUCONATE 0.12 % MT SOLN: 15.0000 mL | Freq: Once | OROMUCOSAL | Status: AC

## 2019-11-30 MED ORDER — MIDAZOLAM HCL 2 MG/2ML IJ SOLN
INTRAMUSCULAR | Status: DC | PRN
  Administered 2019-11-30: 1 mg via INTRAVENOUS

## 2019-11-30 MED ORDER — BUPIVACAINE HCL (PF) 0.5 % IJ SOLN
INTRAMUSCULAR | Status: AC
  Filled 2019-11-30: qty 10

## 2019-11-30 MED ORDER — BUPIVACAINE HCL (PF) 0.5 % IJ SOLN
INTRAMUSCULAR | Status: DC | PRN
  Administered 2019-11-30: 10 mL

## 2019-11-30 MED ORDER — BUPIVACAINE-EPINEPHRINE 0.5% -1:200000 IJ SOLN
INTRAMUSCULAR | Status: DC | PRN
  Administered 2019-11-30: 30 mL

## 2019-11-30 MED ORDER — ORAL CARE MOUTH RINSE: 15.0000 mL | Freq: Once | OROMUCOSAL | Status: AC

## 2019-11-30 MED ORDER — LACTATED RINGERS IV SOLN
INTRAVENOUS | Status: DC | PRN
  Administered 2019-11-30: 1000 mL

## 2019-11-30 MED ORDER — POTASSIUM CHLORIDE IN NACL 20-0.9 MEQ/L-% IV SOLN: INTRAVENOUS | Status: DC

## 2019-11-30 MED ORDER — ONDANSETRON HCL 4 MG/2ML IJ SOLN
INTRAMUSCULAR | Status: DC | PRN
  Administered 2019-11-30: 4 mg via INTRAVENOUS

## 2019-11-30 MED ORDER — FENTANYL CITRATE (PF) 100 MCG/2ML IJ SOLN: 50.0000 ug | Freq: Once | INTRAMUSCULAR | Status: AC

## 2019-11-30 MED ORDER — LACTATED RINGERS IV SOLN: INTRAVENOUS | Status: DC

## 2019-11-30 MED ORDER — GLYCOPYRROLATE 0.2 MG/ML IJ SOLN
INTRAMUSCULAR | Status: DC | PRN
  Administered 2019-11-30: .2 mg via INTRAVENOUS

## 2019-11-30 MED ORDER — LIDOCAINE HCL (PF) 1 % IJ SOLN
INTRAMUSCULAR | Status: AC
  Filled 2019-11-30: qty 5

## 2019-11-30 MED ORDER — CLINDAMYCIN PHOSPHATE 900 MG/50ML IV SOLN
INTRAVENOUS | Status: AC
  Filled 2019-11-30: qty 50

## 2019-11-30 MED ORDER — LIDOCAINE HCL (CARDIAC) PF 100 MG/5ML IV SOSY
PREFILLED_SYRINGE | INTRAVENOUS | Status: DC | PRN
  Administered 2019-11-30: 100 mg via INTRAVENOUS

## 2019-11-30 MED ORDER — MIDAZOLAM HCL 2 MG/2ML IJ SOLN
INTRAMUSCULAR | Status: AC
  Administered 2019-11-30: 1 mg via INTRAVENOUS
  Filled 2019-11-30: qty 2

## 2019-11-30 MED ORDER — FENTANYL CITRATE (PF) 100 MCG/2ML IJ SOLN
INTRAMUSCULAR | Status: AC
  Filled 2019-11-30: qty 2

## 2019-11-30 MED ORDER — CHLORHEXIDINE GLUCONATE 0.12 % MT SOLN
OROMUCOSAL | Status: AC
  Administered 2019-11-30: 15 mL via OROMUCOSAL
  Filled 2019-11-30: qty 15

## 2019-11-30 MED ORDER — METOCLOPRAMIDE HCL 10 MG PO TABS: 5.0000 mg | ORAL_TABLET | Freq: Three times a day (TID) | ORAL | Status: DC | PRN

## 2019-11-30 MED ORDER — SUGAMMADEX SODIUM 200 MG/2ML IV SOLN
INTRAVENOUS | Status: DC | PRN
  Administered 2019-11-30: 200 mg via INTRAVENOUS

## 2019-11-30 MED ORDER — FENTANYL CITRATE (PF) 100 MCG/2ML IJ SOLN
INTRAMUSCULAR | Status: AC
  Administered 2019-11-30: 50 ug via INTRAVENOUS
  Filled 2019-11-30: qty 2

## 2019-11-30 MED ORDER — FAMOTIDINE 20 MG PO TABS: 20.0000 mg | ORAL_TABLET | Freq: Once | ORAL | Status: AC

## 2019-11-30 MED ORDER — EPINEPHRINE PF 1 MG/ML IJ SOLN
INTRAMUSCULAR | Status: AC
  Filled 2019-11-30: qty 2

## 2019-11-30 MED ORDER — PHENYLEPHRINE HCL (PRESSORS) 10 MG/ML IV SOLN
INTRAVENOUS | Status: DC | PRN
  Administered 2019-11-30 (×3): 100 ug via INTRAVENOUS

## 2019-11-30 MED ORDER — METOCLOPRAMIDE HCL 5 MG/ML IJ SOLN: 5.0000 mg | Freq: Three times a day (TID) | INTRAMUSCULAR | Status: DC | PRN

## 2019-11-30 MED ORDER — ACETAMINOPHEN 500 MG PO TABS: 500.0000 mg | ORAL_TABLET | Freq: Four times a day (QID) | ORAL | Status: DC

## 2019-11-30 MED ORDER — ROCURONIUM BROMIDE 100 MG/10ML IV SOLN
INTRAVENOUS | Status: DC | PRN
  Administered 2019-11-30: 50 mg via INTRAVENOUS

## 2019-11-30 MED ORDER — BUPIVACAINE LIPOSOME 1.3 % IJ SUSP
INTRAMUSCULAR | Status: AC
  Filled 2019-11-30: qty 20

## 2019-11-30 MED ORDER — ONDANSETRON HCL 4 MG/2ML IJ SOLN: 4.0000 mg | Freq: Four times a day (QID) | INTRAMUSCULAR | Status: DC | PRN

## 2019-11-30 MED ORDER — MIDAZOLAM HCL 2 MG/2ML IJ SOLN
INTRAMUSCULAR | Status: AC
  Filled 2019-11-30: qty 2

## 2019-11-30 MED ORDER — DEXAMETHASONE SODIUM PHOSPHATE 10 MG/ML IJ SOLN
INTRAMUSCULAR | Status: DC | PRN
  Administered 2019-11-30: 10 mg via INTRAVENOUS

## 2019-11-30 MED ORDER — TRAMADOL HCL 50 MG PO TABS: 50.0000 mg | ORAL_TABLET | Freq: Four times a day (QID) | ORAL | Status: DC

## 2019-11-30 MED ORDER — FENTANYL CITRATE (PF) 100 MCG/2ML IJ SOLN
INTRAMUSCULAR | Status: DC | PRN
Start: 2019-11-30 — End: 2019-11-30
  Administered 2019-11-30 (×2): 50 ug via INTRAVENOUS

## 2019-11-30 SURGICAL SUPPLY — 53 items
ANCH SUT 2 2.9 2 LD TPR NDL (Anchor) ×2 IMPLANT
ANCH SUT 5.5 KNTLS (Anchor) ×1 IMPLANT
ANCHOR HEALICOIL REGEN 5.5 (Anchor) ×2 IMPLANT
ANCHOR JUGGERKNOT WTAP NDL 2.9 (Anchor) ×4 IMPLANT
APL PRP STRL LF DISP 70% ISPRP (MISCELLANEOUS) ×1
BIT DRILL JUGRKNT W/NDL BIT2.9 (DRILL) IMPLANT
BLADE FULL RADIUS 3.5 (BLADE) ×3 IMPLANT
BUR ACROMIONIZER 4.0 (BURR) ×3 IMPLANT
CANNULA SHAVER 8MMX76MM (CANNULA) ×3 IMPLANT
CHLORAPREP W/TINT 26 (MISCELLANEOUS) ×3 IMPLANT
COVER MAYO STAND REUSABLE (DRAPES) ×3 IMPLANT
COVER WAND RF STERILE (DRAPES) ×3 IMPLANT
DILATOR 5.5 THREADED HEALICOIL (MISCELLANEOUS) ×2 IMPLANT
DRAPE IMP U-DRAPE 54X76 (DRAPES) ×6 IMPLANT
DRILL JUGGERKNOT W/NDL BIT 2.9 (DRILL) ×3
ELECT CAUTERY BLADE TIP 2.5 (TIP) ×3
ELECT REM PT RETURN 9FT ADLT (ELECTROSURGICAL) ×3
ELECTRODE CAUTERY BLDE TIP 2.5 (TIP) ×1 IMPLANT
ELECTRODE REM PT RTRN 9FT ADLT (ELECTROSURGICAL) ×1 IMPLANT
GAUZE SPONGE 4X4 12PLY STRL (GAUZE/BANDAGES/DRESSINGS) ×3 IMPLANT
GAUZE XEROFORM 1X8 LF (GAUZE/BANDAGES/DRESSINGS) ×3 IMPLANT
GLOVE BIO SURGEON STRL SZ7.5 (GLOVE) ×6 IMPLANT
GLOVE BIO SURGEON STRL SZ8 (GLOVE) ×6 IMPLANT
GLOVE BIOGEL PI IND STRL 8 (GLOVE) ×1 IMPLANT
GLOVE BIOGEL PI INDICATOR 8 (GLOVE) ×2
GLOVE INDICATOR 8.0 STRL GRN (GLOVE) ×3 IMPLANT
GOWN STRL REUS W/ TWL LRG LVL3 (GOWN DISPOSABLE) ×1 IMPLANT
GOWN STRL REUS W/ TWL XL LVL3 (GOWN DISPOSABLE) ×1 IMPLANT
GOWN STRL REUS W/TWL LRG LVL3 (GOWN DISPOSABLE) ×3
GOWN STRL REUS W/TWL XL LVL3 (GOWN DISPOSABLE) ×3
GRASPER SUT 15 45D LOW PRO (SUTURE) IMPLANT
IV LACTATED RINGER IRRG 3000ML (IV SOLUTION) ×6
IV LR IRRIG 3000ML ARTHROMATIC (IV SOLUTION) ×2 IMPLANT
KIT CANNULA 8X76-LX IN CANNULA (CANNULA) IMPLANT
MANIFOLD NEPTUNE II (INSTRUMENTS) ×3 IMPLANT
MASK FACE SPIDER DISP (MASK) ×3 IMPLANT
MAT ABSORB  FLUID 56X50 GRAY (MISCELLANEOUS) ×2
MAT ABSORB FLUID 56X50 GRAY (MISCELLANEOUS) ×1 IMPLANT
PACK SHDR ARTHRO (MISCELLANEOUS) ×3 IMPLANT
PASSER SUT FIRSTPASS SELF (INSTRUMENTS) IMPLANT
SLING ARM LRG DEEP (SOFTGOODS) ×3 IMPLANT
SLING ULTRA II LG (MISCELLANEOUS) ×3 IMPLANT
STAPLER SKIN PROX 35W (STAPLE) ×3 IMPLANT
STRAP SAFETY 5IN WIDE (MISCELLANEOUS) ×3 IMPLANT
SUT ETHIBOND 0 MO6 C/R (SUTURE) ×3 IMPLANT
SUT ULTRABRAID 2 COBRAID 38 (SUTURE) IMPLANT
SUT VIC AB 2-0 CT1 27 (SUTURE) ×6
SUT VIC AB 2-0 CT1 TAPERPNT 27 (SUTURE) ×2 IMPLANT
TAPE MICROFOAM 4IN (TAPE) ×3 IMPLANT
TUBING ARTHRO INFLOW-ONLY STRL (TUBING) ×3 IMPLANT
TUBING CONNECTING 10 (TUBING) ×1 IMPLANT
TUBING CONNECTING 10' (TUBING) ×1
WAND WEREWOLF FLOW 90D (MISCELLANEOUS) ×3 IMPLANT

## 2019-11-30 NOTE — H&P (Signed)
History of Present Illness:  Angelica Banks is a 39 y.o. female who presents today as a result of a referral by Dr. Precious Reel for right shoulder pain.   [name] symptoms began about a year ago and developed without any specific cause or injury. She saw her rheumatologist, Dr. Precious Reel, who gave her several steroid injections, the last of which was nearly 6 months ago, but each of these injections provided only mild temporary relief of her symptoms. Because of continued symptoms, the patient was sent for an MRI scan and referred to me for further evaluation and treatment. The patient describes the symptoms as moderate (patient is active but has had to make modifications or give up activities) and have the quality of being aching, miserable, nagging, stabbing and tender. The pain is localized to the lateral arm/shoulder. These symptoms are aggravated with normal daily activities, with sleeping, carrying heavy objects, at higher levels of activity, with overhead activity and reaching behind the back. She has tried non-steroidal anti-inflammatories (Advil) and muscle relaxants with temporary partial relief. She has tried rest with no significant benefit. She has tried the 2 steroid injection described above, but has not tried any physical therapy. The patient notes occasional right-sided neck soreness when her shoulder symptoms are more severe and attributes these neck symptoms to "muscle spasms". However, she denies any numbness or paresthesias down her arm to her hand.  This complaint is not work related. She is a sports non-participant.  Shoulder Surgical History:  The patient has had no shoulder surgery in the past.  PMH/PSH/Family History/Social History/Meds/Allergies:  I have reviewed past medical, surgical, social and family history, medications and allergies as documented in the EMR.  Current Outpatient Medications: . ibuprofen (MOTRIN) 200 MG tablet Take 200 mg by mouth 3-4 tablets  as needed  . lisinopriL (ZESTRIL) 20 MG tablet Take 1 tablet by mouth once daily  . methotrexate 25 mg/mL injection Inject 0.9 mLs (22.5 mg total) subcutaneously once a week 12 mL 1  . sarilumab (KEVZARA) 200 mg/1.14 mL Syrg Inject 200 mg subcutaneously every 14 (fourteen) days 6 Syringe 1  . tiZANidine (ZANAFLEX) 4 MG tablet Take 1 tablet (4 mg total) by mouth nightly As needed for muscle spasm 30 tablet 1   No current Epic-ordered facility-administered medications on file.   Allergies:  . Cefaclor Other (See Comments)  "unknown"  . Prednisone Palpitations  . Penicillins Rash  . Sulfasalazine Rash   Past Medical History:  . Endometriosis  . JRA (juvenile rheumatoid arthritis) (CMS-HCC) 10/28/2013  . Migraines 10/28/2013  . Spina bifida (CMS-HCC) 10/28/2013  At L6   Social History   Socioeconomic History  . Marital status: Married  Spouse name: Not on file  . Number of children: Not on file  . Years of education: Not on file  . Highest education level: Not on file  Occupational History  . Not on file  Tobacco Use  . Smoking status: Never Smoker  . Smokeless tobacco: Never Used  Vaping Use  . Vaping Use: Never used  Substance and Sexual Activity  . Alcohol use: Yes  Alcohol/week: 0.0 standard drinks  . Drug use: Not on file  . Sexual activity: Not on file  Other Topics Concern  . Not on file  Social History Narrative  . Not on file   Social Determinants of Health   Financial Resource Strain:  . Difficulty of Paying Living Expenses:  Food Insecurity:  . Worried About Charity fundraiser  in the Last Year:  . [place] in the Last Year:  Transportation Needs:  . Film/video editor (Medical):  Marland Kitchen Lack of Transportation (Non-Medical):   Review of Systems:  A comprehensive 14 point ROS was performed, reviewed, and the pertinent orthopaedic findings are documented in the HPI.  Physical Exam:  Vitals:  11/01/19 1111  BP: 134/86  Weight: 81.2 kg (179 lb)   Height: 162.6 cm (5\' 4" )  PainSc: 0-No pain  PainLoc: Shoulder   General/Constitutional: The patient appears to be well-nourished, well-developed, and in no acute distress. Neuro/Psych: Normal mood and affect, oriented to person, place and time. Eyes: Non-icteric. Pupils are equal, round, and reactive to light, and exhibit synchronous movement. ENT: Unremarkable. Lymphatic: No palpable adenopathy. Respiratory: Lungs clear to auscultation, Normal chest excursion, No wheezes and Non-labored breathing Cardiovascular: Regular rate and rhythm. No murmurs. and No edema, swelling or tenderness, except as noted in detailed exam. Integumentary: No impressive skin lesions present, except as noted in detailed exam. Musculoskeletal: Unremarkable, except as noted in detailed exam.  Right shoulder exam: SKIN: normal SWELLING: none WARMTH: none LYMPH NODES: no adenopathy palpable CREPITUS: none TENDERNESS: Minimally tender over anterolateral shoulder ROM (active):  Forward flexion: 165 degrees Abduction: 160 degrees Internal rotation: T12 ROM (passive):  Forward flexion: 170 degrees Abduction: 165 degrees ER/IR at 90 abd: 95 degrees / 65 degrees  She notes mild soreness at the extremes of forward flexion and abduction.  STRENGTH: Forward flexion: 4+/5 Abduction: 4-4+/5 External rotation: 4+/5 Internal rotation: 4+/5 Pain with RC testing: Mild pain with resisted abduction more so than resisted forward flexion  STABILITY: Normal  SPECIAL TESTS: Luan Pulling' test: positive, mild Speed's test: Mildly positive Capsulitis - pain w/ passive ER: no Crossed arm test: Minimally positive Crank: Not evaluated Anterior apprehension: Negative Posterior apprehension: Not evaluated  She is neurovascularly intact to the right upper extremity.  Imaging:  Shoulder X-Ray Imaging: True AP and Y-scapular views of the right shoulder are available for review. These films demonstrate no evidence for  fractures, lytic lesions, or significant degenerative changes. The subacromial space is well-maintained. There is no subacromial or infra-clavicular spurring. She demonstrates a Type II acromion.  Shoulder Imaging, MRI: Right Shoulder: MRI Shoulder Cartilage: No cartilage abnormality. MRI Shoulder Rotator Cuff: Partial thickness tear of the supraspinatus. No retraction. MRI Shoulder Labrum / Biceps: No labral tear or biceps abormality. MRI Shoulder Bone: Normal bone.  Both the films and report were reviewed by myself and discussed with the patient.  Assessment:  . Nontraumatic incomplete tear of right rotator cuff Yes  . Tendinitis of right rotator cuff   Plan:  The treatment options were discussed with the patient. In addition, patient educational materials were provided regarding the diagnosis and treatment options. The patient is quite frustrated by her symptoms and function limitations, and is ready to consider more aggressive treatment options. Therefore, I have recommended a surgical procedure, specifically a right shoulder arthroscopy with debridement, decompression, rotator cuff repair, and possible biceps tenodesis. The procedure was discussed with the patient, as were the potential risks (including bleeding, infection, nerve and/or blood vessel injury, persistent or recurrent pain, failure of the repair, progression of arthritis, need for further surgery, blood clots, strokes, heart attacks and/or arhythmias, pneumonia, etc.) and benefits. The patient states her understanding and wishes to proceed.  All of the patient's questions and concerns were answered. She can call any time with further concerns. She will follow up post-surgery, routine.   H&P reviewed and  patient re-examined. No changes.

## 2019-11-30 NOTE — Anesthesia Procedure Notes (Signed)
Procedure Name: Intubation Performed by: Fletcher-Harrison, Shirly Bartosiewicz, CRNA Pre-anesthesia Checklist: Patient identified, Emergency Drugs available, Suction available and Patient being monitored Patient Re-evaluated:Patient Re-evaluated prior to induction Oxygen Delivery Method: Circle system utilized Preoxygenation: Pre-oxygenation with 100% oxygen Induction Type: IV induction Ventilation: Mask ventilation without difficulty Laryngoscope Size: McGraph and 3 Grade View: Grade I Tube type: Oral Number of attempts: 1 Airway Equipment and Method: Stylet and Oral airway Placement Confirmation: ETT inserted through vocal cords under direct vision,  positive ETCO2,  breath sounds checked- equal and bilateral and CO2 detector Secured at: 21 cm Tube secured with: Tape Dental Injury: Teeth and Oropharynx as per pre-operative assessment        

## 2019-11-30 NOTE — Transfer of Care (Signed)
Immediate Anesthesia Transfer of Care Note  Patient: Angelica Banks  Procedure(s) Performed: RIGHT SHOULDER ARTHROSCOPY WITH DEBRIDEMENT, DECOMPRESSION, ROTATOR CUFF REPAIR, AND POSSIBLE BICEPS TENODESIS. (Right Shoulder)  Patient Location: PACU  Anesthesia Type:GA combined with regional for post-op pain  Level of Consciousness: sedated  Airway & Oxygen Therapy: Patient Spontanous Breathing and Patient connected to face mask oxygen  Post-op Assessment: Report given to RN and Post -op Vital signs reviewed and stable  Post vital signs: Reviewed and stable  Last Vitals:  Vitals Value Taken Time  BP 122/57 11/30/19 1445  Temp    Pulse 82 11/30/19 1446  Resp 12 11/30/19 1446  SpO2 100 % 11/30/19 1446  Vitals shown include unvalidated device data.  Last Pain:  Vitals:   11/30/19 1159  TempSrc: Temporal  PainSc: 5          Complications: No complications documented.

## 2019-11-30 NOTE — Anesthesia Procedure Notes (Signed)
Anesthesia Regional Block: Interscalene brachial plexus block   Pre-Anesthetic Checklist: ,, timeout performed, Correct Patient, Correct Site, Correct Laterality, Correct Procedure, Correct Position, site marked, Risks and benefits discussed,  Surgical consent,  Pre-op evaluation,  At surgeon's request and post-op pain management  Laterality: Right  Prep: chloraprep, alcohol swabs       Needles:  Injection technique: Single-shot  Needle Type: Stimiplex     Needle Length: 5cm  Needle Gauge: 22     Additional Needles:   Procedures:, nerve stimulator,,, ultrasound used (permanent image in chart),,,,   Nerve Stimulator or Paresthesia:  Response: biceps flexion, 0.5 mA,   Additional Responses:   Narrative:  Start time: 11/30/2019 12:38 PM End time: 11/30/2019 12:50 PM Injection made incrementally with aspirations every 5 mL.  Performed by: Personally  Anesthesiologist: Alvin Critchley, MD  Additional Notes: Functioning IV was confirmed and monitors were applied.  A 68mm 22ga Stimuplex needle was used. Sterile prep and drape,hand hygiene and sterile gloves were used.  Negative aspiration and negative test dose prior to incremental administration of local anesthetic. The patient tolerated the procedure well.  Easy injection with no pain on injection.

## 2019-11-30 NOTE — Discharge Instructions (Addendum)
Orthopedic discharge instructions: Keep dressing dry and intact.  May shower after dressing changed on post-op day #4 (Saturday).  Cover staples with Band-Aids after drying off. Apply ice frequently to shoulder. Take ibuprofen 600-800 mg TID with meals for 7-10 days, then as necessary. Take Tramadol as prescribed when needed.  May supplement with ES Tylenol if necessary. Keep shoulder immobilizer on at all times except may remove for bathing purposes. Follow-up in 10-14 days or as scheduled.  AMBULATORY SURGERY  DISCHARGE INSTRUCTIONS   1) The drugs that you were given will stay in your system until tomorrow so for the next 24 hours you should not:  A) Drive an automobile B) Make any legal decisions C) Drink any alcoholic beverage   2) You may resume regular meals tomorrow.  Today it is better to start with liquids and gradually work up to solid foods.  You may eat anything you prefer, but it is better to start with liquids, then soup and crackers, and gradually work up to solid foods.   3) Please notify your doctor immediately if you have any unusual bleeding, trouble breathing, redness and pain at the surgery site, drainage, fever, or pain not relieved by medication.   4) Additional Instructions:   Please contact your physician with any problems or Same Day Surgery at [phone], Monday through Friday 6 am to 4 pm, or  at [facility] number at [phone]         Interscalene Nerve Block with Exparel  1.  For your surgery you have received an Interscalene Nerve Block with Exparel. 2. Nerve Blocks affect many types of nerves, including nerves that control movement, pain and normal sensation.  You may experience feelings such as numbness, tingling, heaviness, weakness or the inability to move your arm or the feeling or sensation that your arm has "fallen asleep". 3. A nerve block with Exparel can last up to 5 days.  Usually the weakness wears off first.  The  tingling and heaviness usually wear off next.  Finally you may start to notice pain.  Keep in mind that this may occur in any order.  Once a nerve block starts to wear off it is usually completely gone within 60 minutes. 4. ISNB may cause mild shortness of breath, a hoarse voice, blurry vision, unequal pupils, or drooping of the face on the same side as the nerve block.  These symptoms will usually resolve with the numbness.  Very rarely the procedure itself can cause mild seizures. 5. If needed, your surgeon will give you a prescription for pain medication.  It will take about 60 minutes for the oral pain medication to become fully effective.  So, it is recommended that you start taking this medication before the nerve block first begins to wear off, or when you first begin to feel discomfort. 6. Take your pain medication only as prescribed.  Pain medication can cause sedation and decrease your breathing if you take more than you need for the level of pain that you have. 7. Nausea is a common side effect of many pain medications.  You may want to eat something before taking your pain medicine to prevent nausea. 8. After an Interscalene nerve block, you cannot feel pain, pressure or extremes in temperature in the effected arm.  Because your arm is numb it is at an increased risk for injury.  To decrease the possibility of injury, please practice the following:  a. While you are awake change the position of  your arm frequently to prevent too much pressure on any one area for prolonged periods of time. b.  If you have a cast or tight dressing, check the color or your fingers every couple of hours.  Call your surgeon with the appearance of any discoloration (white or blue). c. If you are given a sling to wear before you go home, please wear it  at all times until the block has completely worn off.  Do not get up at night without your sling. d. Please contact [facility] or your surgeon if you do not begin  to regain sensation after 7 days from the surgery.  Anesthesia may be contacted by calling the Same Day Surgery Department, Mon. through Fri., 6 am to 4 pm at [phone].   e. If you experience any other problems or concerns, please contact your surgeon's office. f. If you experience severe or prolonged shortness of breath go to the nearest emergency department.

## 2019-11-30 NOTE — Anesthesia Preprocedure Evaluation (Signed)
Anesthesia Evaluation  Patient identified by MRN, date of birth, ID band Patient awake    Reviewed: Allergy & Precautions, H&P , NPO status , Patient's Chart, lab work & pertinent test results  History of Anesthesia Complications (+) PONV and history of anesthetic complications  Airway Mallampati: III  TM Distance: >3 FB Neck ROM: full    Dental  (+) Chipped   Pulmonary neg shortness of breath, asthma ,           Cardiovascular Exercise Tolerance: Good hypertension, (-) angina(-) Past MI and (-) DOE      Neuro/Psych  Headaches, PSYCHIATRIC DISORDERS    GI/Hepatic Neg liver ROS, GERD  Medicated and Controlled,  Endo/Other  negative endocrine ROS  Renal/GU   Female GU complaint     Musculoskeletal  (+) Arthritis , Rheumatoid disorders,    Abdominal   Peds  Hematology negative hematology ROS (+) anemia ,   Anesthesia Other Findings Past Medical History: No date: ADD (attention deficit disorder) No date: Anemia     Comment:  iron deficiency anemia-LAST HGB 11.8 ON 03-2016 No date: Arthritis     Comment:  Rheumatoid arthritis No date: Asthma     Comment:  well-controlled-no inhalers No date: GERD (gastroesophageal reflux disease)     Comment:  occ- rolaids No date: Headache 04/16/2012: History of Papanicolaou smear of cervix     Comment:  -/- No date: Hyperlipemia No date: Hypertension No date: Pre-eclampsia No date: Preeclampsia No date: Vitamin D deficiency  Past Surgical History: 03/27/2016: DILATATION & CURETTAGE/HYSTEROSCOPY WITH MYOSURE; N/A     Comment:  Procedure: DILATATION & CURETTAGE/HYSTEROSCOPY WITH               MYOSURE, MYOMECTOMY;  Surgeon: Gae Dry, MD;                Location: [facility] ORS;  Service: Gynecology;  Laterality:               N/A; 03/09/2015: [place]; N/A     Comment:  Procedure: DILATATION & CURETTAGE/HYSTEROSCOPY  WITH               NOVASURE ABLATION;  Surgeon: Gae Dry, MD;                Location: [facility] ORS;  Service: Gynecology;  Laterality:               N/A;  BMI    Body Mass Index:  30.21 kg/m      Reproductive/Obstetrics negative OB ROS                             Anesthesia Physical  Anesthesia Plan  ASA: III  Anesthesia Plan: General   Post-op Pain Management:    Induction: Intravenous  PONV Risk Score and Plan: 4 or greater and Ondansetron, Dexamethasone and Midazolam  Airway Management Planned: Oral ETT  Additional Equipment:   Intra-op Plan:   Post-operative Plan: Extubation in OR  Informed Consent: I have reviewed the patients History and Physical, chart, labs and discussed the procedure including the risks, benefits and alternatives for the proposed anesthesia with the patient or authorized representative who has indicated his/her understanding and acceptance.     Dental Advisory Given  Plan Discussed with: Anesthesiologist, CRNA and Surgeon  Anesthesia Plan Comments: (Patient consented for risks of anesthesia including but not limited to:  - adverse reactions to medications -  damage to teeth, lips or other oral mucosa - sore throat or hoarseness - Damage to heart, brain, lungs or loss of life  Patient voiced understanding.)        Anesthesia Quick Evaluation  

## 2019-11-30 NOTE — Op Note (Signed)
11/30/2019  2:32 PM  Patient:   Angelica Banks  Pre-Op Diagnosis:   Impingement/tendinopathy with possible rotator cuff tear, right shoulder.  Post-Op Diagnosis:   Impingement/tendinopathy with partial-thickness rotator cuff tear, labral fraying, and biceps tendinopathy, right shoulder.  Procedure:   Limited arthroscopic debridement, arthroscopic subacromial decompression, mini-open rotator cuff repair, and mini-open biceps tenodesis, right shoulder.  Anesthesia:   General endotracheal with interscalene block using Exparel placed preoperatively by the anesthesiologist.  Surgeon:   Pascal Lux, MD  Assistant:   Cameron Proud, PA-C  Findings:   As above. There was moderate labral fraying involving the posterosuperior portion of the labrum without frank detachment from the glenoid. There was a bursal-sided partial-thickness tear involving the mid-insertional fibers of the supraspinatus tendon. The remainder the rotator cuff was in excellent condition. There was moderate "lip sticking" of the biceps tendon without any partial or full-thickness tears. The articular surfaces of the glenoid and humerus both were in excellent condition.  Complications:   None  Fluids:   1000 cc  Estimated blood loss:   5 cc  Tourniquet time:   None  Drains:   None  Closure:   Staples      Brief clinical note:   The patient is a 39 year old female with a history of right shoulder pain. [name] symptoms have progressed despite medications, activity modification, etc. [name] history and examination are consistent with impingement/tendinopathy with a possible rotator cuff tear. These findings were confirmed by MRI scan. The patient presents at this time for definitive management of her right shoulder symptoms.  Procedure:   The patient underwent placement of an interscalene block using Exparel by the anesthesiologist in the preoperative holding area before being brought into the operating room  and lain in the supine position. The patient then underwent general endotracheal intubation and anesthesia before being repositioned in the beach chair position using the beach chair positioner. The right shoulder and upper extremity were prepped with ChloraPrep solution before being draped sterilely. Preoperative antibiotics were administered. A timeout was performed to confirm the proper surgical site before the expected portal sites and incision site were injected with 0.5% Sensorcaine with epinephrine. A posterior portal was created and the glenohumeral joint thoroughly inspected with the findings as described above. An anterior portal was created using an outside-in technique. The labrum and rotator cuff were further probed, again confirming the above-noted findings. The areas of labral fraying were debrided back to stable margins using the full-radius resector. The ArthroCare wand was inserted and used to release the biceps tendon from its labral anchor.  It also was used to obtain hemostasis as well as to "anneal" the labrum superiorly and anteriorly. The instruments were removed from the joint after suctioning the excess fluid.  The camera was repositioned through the posterior portal into the subacromial space. A separate lateral portal was created using an outside-in technique. The 3.5 mm full-radius resector was introduced and used to perform a subtotal bursectomy. The ArthroCare wand was then inserted and used to remove the periosteal tissue off the undersurface of the anterior third of the acromion as well as to recess the coracoacromial ligament from its attachment along the anterior and lateral margins of the acromion. The 4.0 mm acromionizing bur was introduced and used to complete the decompression by removing the undersurface of the anterior third of the acromion. The full radius resector was reintroduced to remove any residual bony debris before the ArthroCare wand was reintroduced to obtain  hemostasis.  The instruments were then removed from the subacromial space after suctioning the excess fluid.  An approximately 4-5 cm incision was made over the anterolateral aspect of the shoulder beginning at the anterolateral corner of the acromion and extending distally in line with the bicipital groove. This incision was carried down through the subcutaneous tissues to expose the deltoid fascia. The raphae between the anterior and middle thirds was identified and this plane developed to provide access into the subacromial space. Additional bursal tissues were debrided sharply using Metzenbaum scissors. The rotator cuff tear was readily identified. The margins were debrided sharply with a #15 blade and the exposed greater tuberosity roughened with a rongeur. The tear was repaired using a single Biomet 2.9 mm JuggerKnot anchor. These sutures were then brought back laterally and secured using one Capitan knotless Regenesorb anchor to create a two-layer closure. An apparent watertight closure was obtained.  The bicipital groove was identified by palpation and opened for 1-1.5 cm. The biceps tendon stump was retrieved through this defect. The floor of the bicipital groove was roughened with a curet before another Biomet 2.9 mm JuggerKnot anchor was inserted. Both sets of sutures were passed through the biceps tendon and tied securely to effect the tenodesis. The bicipital sheath was reapproximated using two #0 Ethibond interrupted sutures, incorporating the biceps tendon to further reinforce the tenodesis.  The wound was copiously irrigated with sterile saline solution before the deltoid raphae was reapproximated using 2-0 Vicryl interrupted sutures. The subcutaneous tissues were closed in two layers using 2-0 Vicryl interrupted sutures before the skin was closed using staples. The portal sites also were closed using staples. A sterile bulky dressing was applied to the shoulder before the arm  was placed into a shoulder immobilizer. The patient was then awakened, extubated, and returned to the recovery room in satisfactory condition after tolerating the procedure well.

## 2019-12-01 ENCOUNTER — Encounter: Payer: Self-pay | Admitting: Surgery

## 2019-12-03 NOTE — Anesthesia Postprocedure Evaluation (Signed)
Anesthesia Post Note  Patient: Angelica Banks  Procedure(s) Performed: RIGHT SHOULDER ARTHROSCOPY WITH DEBRIDEMENT, DECOMPRESSION, ROTATOR CUFF REPAIR, AND POSSIBLE BICEPS TENODESIS. (Right Shoulder)  Patient location during evaluation: PACU Anesthesia Type: General Level of consciousness: awake and alert and oriented Pain management: pain level controlled Vital Signs Assessment: post-procedure vital signs reviewed and stable Respiratory status: spontaneous breathing Cardiovascular status: blood pressure returned to baseline Anesthetic complications: no   No complications documented.   Last Vitals:  Vitals:   11/30/19 1530 11/30/19 1542  BP: 118/75 (!) 144/88  Pulse: 85 68  Resp: 18 12  Temp: 36.4 C   SpO2: 99% 99%    Last Pain:  Vitals:   11/30/19 1542  TempSrc:   PainSc: 0-No pain                 Ryleeann Urquiza

## 2020-01-27 ENCOUNTER — Other Ambulatory Visit: Payer: Self-pay | Admitting: Obstetrics and Gynecology

## 2020-01-27 DIAGNOSIS — I10 Essential (primary) hypertension: Secondary | ICD-10-CM

## 2020-02-14 ENCOUNTER — Other Ambulatory Visit: Payer: Self-pay | Admitting: Obstetrics and Gynecology

## 2020-02-14 DIAGNOSIS — I10 Essential (primary) hypertension: Secondary | ICD-10-CM

## 2020-02-14 MED ORDER — LISINOPRIL 20 MG PO TABS: 20.0000 mg | ORAL_TABLET | Freq: Every day | ORAL | 0 refills | Status: DC

## 2020-02-14 NOTE — Progress Notes (Signed)
Rx RF lisinopril till 12/21 annual

## 2020-02-14 NOTE — Telephone Encounter (Signed)
Called pt, no answer, LVMTRC. 

## 2020-02-14 NOTE — Telephone Encounter (Signed)
Patient reports she has apt 03/29/2020 for AE. Requesting refill of Lisinopril to [facility]. [phone]

## 2020-02-14 NOTE — Telephone Encounter (Signed)
Pt aware.

## 2020-02-14 NOTE — Telephone Encounter (Signed)
Pls let pt know Rx eRxd. Thx.

## 2020-03-28 NOTE — Progress Notes (Signed)
Chief Complaint  Patient presents with  . Gynecologic Exam  . Vaginal Discharge    Slight fishy odor, irritation since Monday     HPI:      [name] is a 39 y.o. G1P1001 who LMP was Patient's last menstrual period was 06/18/2017 (exact date)., presents today for her annual examination.  Her menses are absent due to TAH 3/19 at [facility] due to endometriosis. No PMB. Had colon resection at that time due to endometriosis as well.  Sx much improved and pt feeling better.    Sex activity: single partner, contraception -hyst. No dyspareunia/bleeding. Hx of recurrent BV, last treated 7/20. Having same sx for a few days, but no sx in 1 1/2 yrs. Has tried boric acid supp in past.   Last Pap: May 30, 2015  Results were: no abnormalities /neg HPV DNA . No longer indicated.  There is a FH of breast cancer in her pat aunt, genetic testing not indicated. There is no FH of ovarian cancer. The patient does do self-breast exams.  Tobacco use: The patient denies current or previous tobacco use. Alcohol use: social drinker No drug use. Exercise: mod active  She does get adequate calcium and Vitamin D in her diet.   We are managing her HTN. She is on lisinopril 20 mg daily, changed from methyldopa in past since no longer controlling her BP. Now has ACEI cough for past 6 months. Took BP med this AM. BPs at home usually ~130/70s. Had normal BP with rheumatology 11/21. Normal BMP 11/21 at [facility].   Past Medical History:  Diagnosis Date  . ADD (attention deficit disorder)   . Anemia    iron deficiency anemia-LAST HGB 11.8 ON 03-2016  . Arthritis    Rheumatoid arthritis  . Asthma    well-controlled-no inhalers  . GERD (gastroesophageal reflux disease)    occ- rolaids  . Headache   . History of Papanicolaou smear of cervix 04/16/2012   -/-  . Hyperlipemia   . Hypertension   . Pre-eclampsia   . Preeclampsia   . Vitamin D deficiency     Past Surgical History:  Procedure  Laterality Date  . ABDOMINAL HYSTERECTOMY  07/04/2017  . DILATATION & CURETTAGE/HYSTEROSCOPY WITH MYOSURE N/A 03/27/2016   Procedure: Obert WITH MYOSURE, MYOMECTOMY;  Surgeon: Gae Dry, MD;  Location: [facility];  Service: Gynecology;  Laterality: N/A;  . DILITATION & CURRETTAGE/HYSTROSCOPY WITH NOVASURE ABLATION N/A 03/09/2015   Procedure: DILATATION & CURETTAGE/HYSTEROSCOPY WITH NOVASURE ABLATION;  Surgeon: Gae Dry, MD;  Location: [facility];  Service: Gynecology;  Laterality: N/A;  . LAPAROSCOPIC BILATERAL SALPINGECTOMY Right 03/06/2017   Procedure: LAPAROSCOPIC  SALPINGECTOMY WITH REMOVAL OF ANY ABNORMAL TISSUE NOTED;  Surgeon: Will Bonnet, MD;  Location: [facility];  Service: Gynecology;  Laterality: Right;  . LAPAROSCOPIC OVARIAN CYSTECTOMY Right 03/06/2017   Procedure: LAPAROSCOPIC OVARIAN CYSTECTOMY;  Surgeon: Will Bonnet, MD;  Location: [facility];  Service: Gynecology;  Laterality: Right;  . LAPAROSCOPY N/A 03/06/2017   Procedure: LAPAROSCOPY OPERATIVE;  Surgeon: Will Bonnet, MD;  Location: [facility];  Service: Gynecology;  Laterality: N/A;  . SHOULDER ARTHROSCOPY WITH OPEN ROTATOR CUFF REPAIR Right 11/30/2019   Procedure: RIGHT SHOULDER ARTHROSCOPY WITH DEBRIDEMENT, DECOMPRESSION, ROTATOR CUFF REPAIR, AND POSSIBLE BICEPS TENODESIS.;  Surgeon: Corky Mull, MD;  Location: [facility];  Service: Orthopedics;  Laterality: Right;    Family History  Problem Relation Age of Onset  . Hypertension Mother   .  Other Mother        Respiratory disorder  . Hypertension Father   . Hyperlipidemia Father   . Lymphoma Father 74       non hodgkins   . Brain cancer Paternal Uncle   . Lung cancer Maternal Grandmother 68       not sure if tested, has communication  . Breast cancer Paternal Aunt 61       not sure, has communication  . Lung cancer Maternal Grandfather     Social History   Socioeconomic History  . Marital status: Married     Spouse name: Not on file  . Number of children: 1  . Years of education: 67  . Highest education level: Not on file  Occupational History  . Occupation: business    Comment: Surveyor, minerals  Tobacco Use  . Smoking status: Never Smoker  . Smokeless tobacco: Never Used  Vaping Use  . Vaping Use: Never used  Substance and Sexual Activity  . Alcohol use: Yes    Comment: rarely  . Drug use: No  . Sexual activity: Yes    Birth control/protection: None, Surgical    Comment: Hysterectomy   Other Topics Concern  . Not on file  Social History Narrative  . Not on file   Social Determinants of Health   Financial Resource Strain: Not on file  Food Insecurity: Not on file  Transportation Needs: Not on file  Physical Activity: Not on file  Stress: Not on file  Social Connections: Not on file  Intimate Partner Violence: Not on file    Current Outpatient Medications on File Prior to Visit  Medication Sig Dispense Refill  . fluticasone (FLONASE) 50 MCG/ACT nasal spray Place 2 sprays into both nostrils daily. (Patient taking differently: Place 2 sprays into both nostrils daily as needed for allergies.) 16 g 0  . ibuprofen (ADVIL,MOTRIN) 600 MG tablet Take 1 tablet (600 mg total) by mouth every 6 (six) hours as needed for mild pain or cramping. 30 tablet 0  . KEVZARA 200 MG/1.14ML SOSY Inject 200 mg into the skin every 14 (fourteen) days.     . methotrexate 50 MG/2ML injection Inject 22.5 mg into the muscle once a week. 0.9 ml  1  . traMADol (ULTRAM) 50 MG tablet Take 1-2 tablets (50-100 mg total) by mouth every 6 (six) hours as needed for moderate pain or severe pain. 40 tablet 0   No current facility-administered medications on file prior to visit.     ROS:  Review of Systems  Constitutional: Negative for fatigue, fever and unexpected weight change.  Respiratory: Negative for cough, shortness of breath and wheezing.   Cardiovascular: Negative for chest pain, palpitations and leg  swelling.  Gastrointestinal: Negative for blood in stool, constipation, diarrhea, nausea and vomiting.  Endocrine: Negative for cold intolerance, heat intolerance and polyuria.  Genitourinary: Negative for dyspareunia, dysuria, flank pain, frequency, genital sores, hematuria, menstrual problem, pelvic pain, urgency, vaginal bleeding, vaginal discharge and vaginal pain.  Musculoskeletal: Negative for back pain, joint swelling and myalgias.  Skin: Negative for rash.  Neurological: Negative for dizziness, syncope, light-headedness, numbness and headaches.  Hematological: Negative for adenopathy.  Psychiatric/Behavioral: Negative for agitation, confusion, sleep disturbance and suicidal ideas. The patient is not nervous/anxious.      Objective: BP 140/90   Ht 5\' 4"  (1.626 m)   Wt 183 lb (83 kg)   LMP 06/18/2017 (Exact Date)   BMI 31.41 kg/m    Physical Exam Constitutional:  Appearance: She is well-developed.  Genitourinary:     Vulva normal.     Genitourinary Comments: UTERUS/CX SURG REM     Right Labia: No rash or tenderness.    Left Labia: No tenderness or rash.    Vaginal cuff intact.    Vaginal discharge present.     No vaginal erythema or tenderness.      Right Adnexa: not tender and no mass present.    Left Adnexa: not tender and no mass present.    Cervix is absent.     Uterus is absent.  Breasts:     Right: No mass, nipple discharge, skin change or tenderness.     Left: No mass, nipple discharge, skin change or tenderness.    Neck:     Thyroid: No thyromegaly.  Cardiovascular:     Rate and Rhythm: Normal rate and regular rhythm.     Heart sounds: Normal heart sounds. No murmur heard.   Pulmonary:     Effort: Pulmonary effort is normal.     Breath sounds: Normal breath sounds.  Abdominal:     Palpations: Abdomen is soft.     Tenderness: There is no abdominal tenderness. There is no guarding.  Musculoskeletal:        General: Normal range of motion.      Cervical back: Normal range of motion.  Neurological:     General: No focal deficit present.     Mental Status: She is alert and oriented to person, place, and time.     Cranial Nerves: No cranial nerve deficit.  Skin:    General: Skin is warm and dry.  Psychiatric:        Mood and Affect: Mood normal.        Behavior: Behavior normal.        Thought Content: Thought content normal.        Judgment: Judgment normal.  Vitals reviewed.     Assessment/Plan:  Encounter for annual routine gynecological examination  Essential hypertension - Plan: losartan (COZAAR) 50 MG tablet; now with ACEI cough. Change to losartan. Rx eRxd. Pt to keep BP check at home and f/u via phone in 4 wks re: BP  BV (bacterial vaginosis) - Plan: metroNIDAZOLE (FLAGYL) 500 MG tablet; pos sx and hx of BV in past. Rx flagyl, no EtOH. F/u prn. Will RF if sx recur.   Meds ordered this encounter  Medications  . metroNIDAZOLE (FLAGYL) 500 MG tablet    Sig: Take 1 tablet (500 mg total) by mouth 2 (two) times daily for 7 days.    Dispense:  14 tablet    Refill:  0    Order Specific Question:   Supervising Provider    Answer:   Gae Dry [id number]  . losartan (COZAAR) 50 MG tablet    Sig: Take 1 tablet (50 mg total) by mouth daily.    Dispense:  30 tablet    Refill:  0    Order Specific Question:   Supervising Provider    Answer:   Gae Dry [[id number]]             GYN counsel menopause, adequate intake of calcium and vitamin D, diet and exercise     F/U  Return in about 1 year (around 03/29/2021).  Casaundra Takacs B. Murlin Schrieber, PA-C 03/29/2020 8:32 AM

## 2020-03-29 ENCOUNTER — Other Ambulatory Visit: Payer: Self-pay

## 2020-03-29 ENCOUNTER — Other Ambulatory Visit: Payer: Self-pay | Admitting: Obstetrics and Gynecology

## 2020-03-29 ENCOUNTER — Encounter: Payer: Self-pay | Admitting: Obstetrics and Gynecology

## 2020-03-29 ENCOUNTER — Ambulatory Visit (INDEPENDENT_AMBULATORY_CARE_PROVIDER_SITE_OTHER): Payer: No Typology Code available for payment source | Admitting: Obstetrics and Gynecology

## 2020-03-29 VITALS — BP 140/90 | Ht 64.0 in | Wt 183.0 lb

## 2020-03-29 DIAGNOSIS — I10 Essential (primary) hypertension: Secondary | ICD-10-CM

## 2020-03-29 DIAGNOSIS — Z01419 Encounter for gynecological examination (general) (routine) without abnormal findings: Secondary | ICD-10-CM

## 2020-03-29 DIAGNOSIS — B9689 Other specified bacterial agents as the cause of diseases classified elsewhere: Secondary | ICD-10-CM

## 2020-03-29 DIAGNOSIS — N760 Acute vaginitis: Secondary | ICD-10-CM

## 2020-03-29 MED ORDER — METRONIDAZOLE 500 MG PO TABS: 500.0000 mg | ORAL_TABLET | Freq: Two times a day (BID) | ORAL | 0 refills | Status: AC

## 2020-03-29 MED ORDER — LOSARTAN POTASSIUM 50 MG PO TABS: 50.0000 mg | ORAL_TABLET | Freq: Every day | ORAL | 0 refills | Status: DC

## 2020-03-29 NOTE — Patient Instructions (Signed)
I value your feedback and entrusting us with your care. If you get a [facility] patient survey, I would appreciate you taking the time to let us know about your experience today. Thank you!  As of March 18, 2019, your lab results will be released to your MyChart immediately, before I even have a chance to see them. Please give me time to review them and contact you if there are any abnormalities. Thank you for your patience.  

## 2020-05-09 ENCOUNTER — Encounter: Payer: Self-pay | Admitting: Obstetrics and Gynecology

## 2020-05-10 ENCOUNTER — Other Ambulatory Visit: Payer: Self-pay | Admitting: Obstetrics and Gynecology

## 2020-05-10 DIAGNOSIS — I10 Essential (primary) hypertension: Secondary | ICD-10-CM

## 2020-05-10 MED ORDER — LOSARTAN POTASSIUM 100 MG PO TABS: 100.0000 mg | ORAL_TABLET | Freq: Every day | ORAL | 0 refills | Status: DC

## 2020-05-10 NOTE — Progress Notes (Signed)
Rx increase losartan for BP control

## 2020-05-12 ENCOUNTER — Other Ambulatory Visit: Payer: Self-pay | Admitting: Obstetrics and Gynecology

## 2020-05-12 MED ORDER — HYDROCHLOROTHIAZIDE 12.5 MG PO CAPS: 12.5000 mg | ORAL_CAPSULE | Freq: Every day | ORAL | 0 refills | Status: DC

## 2020-05-23 NOTE — Telephone Encounter (Signed)
LM to check on pt with BP mgmt.

## 2020-06-21 ENCOUNTER — Other Ambulatory Visit: Payer: Self-pay | Admitting: Obstetrics and Gynecology

## 2020-06-21 DIAGNOSIS — I10 Essential (primary) hypertension: Secondary | ICD-10-CM

## 2020-06-21 MED ORDER — LOSARTAN POTASSIUM 100 MG PO TABS: 100.0000 mg | ORAL_TABLET | Freq: Every day | ORAL | 0 refills | Status: DC

## 2020-06-21 MED ORDER — HYDROCHLOROTHIAZIDE 12.5 MG PO CAPS: 25.0000 mg | ORAL_CAPSULE | Freq: Every day | ORAL | 0 refills | Status: DC

## 2020-06-21 NOTE — Progress Notes (Signed)
Rx RF for BP mgmt. Increase HCTZ to 25 mg dose. If works will do combo pill.

## 2020-06-30 ENCOUNTER — Other Ambulatory Visit: Payer: Self-pay | Admitting: Obstetrics and Gynecology

## 2020-06-30 ENCOUNTER — Telehealth: Payer: Self-pay | Admitting: Obstetrics and Gynecology

## 2020-06-30 DIAGNOSIS — I10 Essential (primary) hypertension: Secondary | ICD-10-CM

## 2020-06-30 MED ORDER — AMLODIPINE BESYLATE 5 MG PO TABS: 5.0000 mg | ORAL_TABLET | Freq: Every day | ORAL | 0 refills | Status: DC

## 2020-06-30 NOTE — Telephone Encounter (Signed)
Per ABC, called pt to see if she can get in with PCP, she has no PCP.

## 2020-06-30 NOTE — Telephone Encounter (Signed)
Rx norvasc for uncontrolled HTN.

## 2020-07-04 ENCOUNTER — Ambulatory Visit (INDEPENDENT_AMBULATORY_CARE_PROVIDER_SITE_OTHER): Payer: 59 | Admitting: Obstetrics and Gynecology

## 2020-07-04 ENCOUNTER — Other Ambulatory Visit: Payer: Self-pay

## 2020-07-04 ENCOUNTER — Encounter: Payer: Self-pay | Admitting: Obstetrics and Gynecology

## 2020-07-04 VITALS — BP 150/100 | Ht 64.0 in | Wt 185.0 lb

## 2020-07-04 DIAGNOSIS — B9689 Other specified bacterial agents as the cause of diseases classified elsewhere: Secondary | ICD-10-CM

## 2020-07-04 DIAGNOSIS — B373 Candidiasis of vulva and vagina: Secondary | ICD-10-CM | POA: Diagnosis not present

## 2020-07-04 DIAGNOSIS — N760 Acute vaginitis: Secondary | ICD-10-CM | POA: Diagnosis not present

## 2020-07-04 DIAGNOSIS — I10 Essential (primary) hypertension: Secondary | ICD-10-CM

## 2020-07-04 DIAGNOSIS — B3731 Acute candidiasis of vulva and vagina: Secondary | ICD-10-CM

## 2020-07-04 LAB — POCT WET PREP WITH KOH
Clue Cells Wet Prep HPF POC: POSITIVE
KOH Prep POC: POSITIVE — AB
Trichomonas, UA: NEGATIVE

## 2020-07-04 MED ORDER — METRONIDAZOLE 500 MG PO TABS: 500.0000 mg | ORAL_TABLET | Freq: Two times a day (BID) | ORAL | 0 refills | Status: AC

## 2020-07-04 MED ORDER — FLUCONAZOLE 150 MG PO TABS: 150.0000 mg | ORAL_TABLET | Freq: Once | ORAL | 0 refills | Status: AC

## 2020-07-04 NOTE — Progress Notes (Signed)
Angelica Banks, Angelica Evener, PA-C   Chief Complaint  Patient presents with  . Vaginal Itching    Irritation, blood when wipes, denies uti sx x since friday    HPI:      Angelica Banks is a 40 y.o. G1P1001 whose LMP was Patient's last menstrual period was 06/18/2017 (exact date)., presents today for vaginal itching/irritation with increased d/c for 5 days; no fishy odor. Notes scant amt blood with wiping, feels like raw areas. No recent abx use. Has treated with 4 days of monistat-7; cream causes burning. No urin sx. S/p TAH 3/19 at [facility] due to endometriosis. Hx of BV in past.  She is sex active, no new partners.  Pt also being followed for uncontrolled HTN. Was on lisinopril 20 mg with good BP control but developed ACEI cough. Changed to losartan and have had to increase dose to 100 mg daily, still without BP control. Added HCTZ and now up to 25 mg dose without BP control. Just added norvasc a few days ago (pt started med this AM). Has pressure in her head, no headaches, no vision changes. Pt doesn't have PCP so I'm trying to get her in with one. Normal CMP 11/21 with PCP.   Pt with hx of RA, had to stop meds for 3 months due to insurance. Has restarted them recently and wonder is absence of meds affecting BP.   Past Medical History:  Diagnosis Date  . ADD (attention deficit disorder)   . Anemia    iron deficiency anemia-LAST HGB 11.8 ON 03-2016  . Arthritis    Rheumatoid arthritis  . Asthma    well-controlled-no inhalers  . GERD (gastroesophageal reflux disease)    occ- rolaids  . Headache   . History of Papanicolaou smear of cervix 04/16/2012   -/-  . Hyperlipemia   . Hypertension   . Pre-eclampsia   . Preeclampsia   . Vitamin D deficiency     Past Surgical History:  Procedure Laterality Date  . ABDOMINAL HYSTERECTOMY  07/04/2017  . DILATATION & CURETTAGE/HYSTEROSCOPY WITH MYOSURE N/A 03/27/2016   Procedure: [place] WITH MYOSURE, MYOMECTOMY;   Surgeon: Gae Dry, MD;  Location: [facility];  Service: Gynecology;  Laterality: N/A;  . DILITATION & CURRETTAGE/HYSTROSCOPY WITH NOVASURE ABLATION N/A 03/09/2015   Procedure: DILATATION & CURETTAGE/HYSTEROSCOPY WITH NOVASURE ABLATION;  Surgeon: Gae Dry, MD;  Location: [facility];  Service: Gynecology;  Laterality: N/A;  . LAPAROSCOPIC BILATERAL SALPINGECTOMY Right 03/06/2017   Procedure: LAPAROSCOPIC  SALPINGECTOMY WITH REMOVAL OF ANY ABNORMAL TISSUE NOTED;  Surgeon: Will Bonnet, MD;  Location: [facility];  Service: Gynecology;  Laterality: Right;  . LAPAROSCOPIC OVARIAN CYSTECTOMY Right 03/06/2017   Procedure: LAPAROSCOPIC OVARIAN CYSTECTOMY;  Surgeon: Will Bonnet, MD;  Location: [facility];  Service: Gynecology;  Laterality: Right;  . LAPAROSCOPY N/A 03/06/2017   Procedure: LAPAROSCOPY OPERATIVE;  Surgeon: Will Bonnet, MD;  Location: [facility];  Service: Gynecology;  Laterality: N/A;  . SHOULDER ARTHROSCOPY WITH OPEN ROTATOR CUFF REPAIR Right 11/30/2019   Procedure: RIGHT SHOULDER ARTHROSCOPY WITH DEBRIDEMENT, DECOMPRESSION, ROTATOR CUFF REPAIR, AND POSSIBLE BICEPS TENODESIS.;  Surgeon: Corky Mull, MD;  Location: [facility];  Service: Orthopedics;  Laterality: Right;    Family History  Problem Relation Age of Onset  . Hypertension Mother   . Other Mother        Respiratory disorder  . Hypertension Father   . Hyperlipidemia Father   . Lymphoma Father 35  non hodgkins   . Brain cancer Paternal Uncle   . Lung cancer Maternal Grandmother 68       not sure if tested, has communication  . Breast cancer Paternal Aunt 109       not sure, has communication  . Lung cancer Maternal Grandfather     Social History   Socioeconomic History  . Marital status: Married    Spouse name: Not on file  . Number of children: 1  . Years of education: 5  . Highest education level: Not on file  Occupational History  . Occupation: business    Comment: Surveyor, minerals   Tobacco Use  . Smoking status: Never Smoker  . Smokeless tobacco: Never Used  Vaping Use  . Vaping Use: Never used  Substance and Sexual Activity  . Alcohol use: Yes    Comment: rarely  . Drug use: No  . Sexual activity: Yes    Birth control/protection: None, Surgical    Comment: Hysterectomy   Other Topics Concern  . Not on file  Social History Narrative  . Not on file   Social Determinants of Health   Financial Resource Strain: Not on file  Food Insecurity: Not on file  Transportation Needs: Not on file  Physical Activity: Not on file  Stress: Not on file  Social Connections: Not on file  Intimate Partner Violence: Not on file    Outpatient Medications Prior to Visit  Medication Sig Dispense Refill  . amLODipine (NORVASC) 5 MG tablet Take 1 tablet (5 mg total) by mouth daily. 30 tablet 0  . fluticasone (FLONASE) 50 MCG/ACT nasal spray Place 2 sprays into both nostrils daily. (Patient taking differently: Place 2 sprays into both nostrils daily as needed for allergies.) 16 g 0  . hydrochlorothiazide (MICROZIDE) 12.5 MG capsule Take 2 capsules (25 mg total) by mouth daily. 30 capsule 0  . ibuprofen (ADVIL,MOTRIN) 600 MG tablet Take 1 tablet (600 mg total) by mouth every 6 (six) hours as needed for mild pain or cramping. 30 tablet 0  . KEVZARA 200 MG/1.14ML SOSY Inject 200 mg into the skin every 14 (fourteen) days.     Marland Kitchen losartan (COZAAR) 100 MG tablet Take 1 tablet (100 mg total) by mouth daily. 30 tablet 0  . methotrexate 50 MG/2ML injection Inject 22.5 mg into the muscle once a week. 0.9 ml  1  . traMADol (ULTRAM) 50 MG tablet Take 1-2 tablets (50-100 mg total) by mouth every 6 (six) hours as needed for moderate pain or severe pain. 40 tablet 0   No facility-administered medications prior to visit.      ROS:  Review of Systems  Constitutional: Negative for fever.  Gastrointestinal: Negative for blood in stool, constipation, diarrhea, nausea and vomiting.   Genitourinary: Positive for vaginal discharge and vaginal pain. Negative for dyspareunia, dysuria, flank pain, frequency, hematuria, urgency and vaginal bleeding.  Musculoskeletal: Negative for back pain.  Skin: Negative for rash.    OBJECTIVE:   Vitals:  BP (!) 150/100   Ht 5\' 4"  (1.626 m)   Wt 185 lb (83.9 kg)   LMP 06/18/2017 (Exact Date)   BMI 31.76 kg/m   Physical Exam Vitals reviewed.  Constitutional:      Appearance: She is well-developed.  Pulmonary:     Effort: Pulmonary effort is normal.  Genitourinary:    General: Normal vulva.     Pubic Area: No rash.      Labia:        Right:  Tenderness present. No rash or lesion.        Left: Tenderness present. No rash or lesion.      Vagina: Vaginal discharge present. No erythema or tenderness.     Cervix: Normal.     Uterus: Normal. Not enlarged and not tender.      Adnexa: Right adnexa normal and left adnexa normal.       Right: No mass or tenderness.         Left: No mass or tenderness.       Comments: BILAT ERYTHEMA AT INTROITUS Musculoskeletal:        General: Normal range of motion.     Cervical back: Normal range of motion.  Skin:    General: Skin is warm and dry.  Neurological:     General: No focal deficit present.     Mental Status: She is alert and oriented to person, place, and time.  Psychiatric:        Mood and Affect: Mood normal.        Behavior: Behavior normal.        Thought Content: Thought content normal.        Judgment: Judgment normal.     Results: Results for orders placed or performed in visit on 07/04/20 (from the past 24 hour(s))  POCT Wet Prep with KOH     Status: Abnormal   Collection Time: 07/04/20 12:02 PM  Result Value Ref Range   Trichomonas, UA Negative    Clue Cells Wet Prep HPF POC pos    Epithelial Wet Prep HPF POC     Yeast Wet Prep HPF POC few    Bacteria Wet Prep HPF POC     RBC Wet Prep HPF POC     WBC Wet Prep HPF POC     KOH Prep POC Positive (A) Negative      Assessment/Plan: Candidal vaginitis - Plan: fluconazole (DIFLUCAN) 150 MG tablet, POCT Wet Prep with KOH; pos sx and wet prep. Rx diflucan. F/u prn.   Bacterial vaginosis - Plan: metroNIDAZOLE (FLAGYL) 500 MG tablet, POCT Wet Prep with KOH; pos wet prep. Rx flagyl, no EtOH. F/u prn.   Uncontrolled hypertension--norvasc added today. Will cont to monitor BP and will get pt in with PCP. Pt to email me BP readings later this wk.   Meds ordered this encounter  Medications  . fluconazole (DIFLUCAN) 150 MG tablet    Sig: Take 1 tablet (150 mg total) by mouth once for 1 dose.    Dispense:  1 tablet    Refill:  0    Order Specific Question:   Supervising Provider    Answer:   Gae Dry [id number]  . metroNIDAZOLE (FLAGYL) 500 MG tablet    Sig: Take 1 tablet (500 mg total) by mouth 2 (two) times daily for 7 days.    Dispense:  14 tablet    Refill:  0    Order Specific Question:   Supervising Provider    Answer:   Gae Dry [[id number]]      Return if symptoms worsen or fail to improve.  Osaze Hubbert B. Efosa Treichler, PA-C 07/04/2020 12:03 PM

## 2020-07-04 NOTE — Patient Instructions (Signed)
I value your feedback and you entrusting us with your care. If you get a [facility] patient survey, I would appreciate you taking the time to let us know about your experience today. Thank you! ? ? ?

## 2020-07-26 ENCOUNTER — Other Ambulatory Visit: Payer: Self-pay | Admitting: Obstetrics and Gynecology

## 2020-07-26 ENCOUNTER — Encounter: Payer: Self-pay | Admitting: Obstetrics and Gynecology

## 2020-07-26 DIAGNOSIS — I10 Essential (primary) hypertension: Secondary | ICD-10-CM

## 2020-07-26 MED ORDER — HYDROCHLOROTHIAZIDE 12.5 MG PO CAPS: 25.0000 mg | ORAL_CAPSULE | Freq: Every day | ORAL | 1 refills | Status: DC

## 2020-07-26 MED ORDER — AMLODIPINE BESYLATE 5 MG PO TABS: 10.0000 mg | ORAL_TABLET | Freq: Every day | ORAL | 1 refills | Status: DC

## 2020-07-26 MED ORDER — LOSARTAN POTASSIUM 100 MG PO TABS: 100.0000 mg | ORAL_TABLET | Freq: Every day | ORAL | 1 refills | Status: DC

## 2020-07-26 NOTE — Progress Notes (Signed)
Rx RF BP meds. Has PCP appt 6/22

## 2020-09-04 ENCOUNTER — Other Ambulatory Visit: Payer: Self-pay | Admitting: Obstetrics and Gynecology

## 2020-09-04 DIAGNOSIS — I10 Essential (primary) hypertension: Secondary | ICD-10-CM

## 2020-09-20 ENCOUNTER — Other Ambulatory Visit: Payer: Self-pay | Admitting: Obstetrics and Gynecology

## 2020-09-20 ENCOUNTER — Ambulatory Visit: Payer: 59 | Admitting: Adult Health

## 2020-09-20 ENCOUNTER — Encounter: Payer: Self-pay | Admitting: Obstetrics and Gynecology

## 2020-09-20 DIAGNOSIS — I10 Essential (primary) hypertension: Secondary | ICD-10-CM

## 2020-09-20 MED ORDER — HYDROCHLOROTHIAZIDE 12.5 MG PO CAPS
25.0000 mg | ORAL_CAPSULE | Freq: Every day | ORAL | 0 refills | Status: DC
Start: 2020-09-20 — End: 2024-01-04

## 2020-09-20 MED ORDER — AMLODIPINE BESYLATE 10 MG PO TABS: 10.0000 mg | ORAL_TABLET | Freq: Every day | ORAL | 0 refills | Status: AC

## 2020-09-20 MED ORDER — LOSARTAN POTASSIUM 100 MG PO TABS: 100.0000 mg | ORAL_TABLET | Freq: Every day | ORAL | 0 refills | Status: AC

## 2020-09-20 NOTE — Progress Notes (Signed)
Rx RF BP meds till PCP appt 9/22

## 2020-10-14 ENCOUNTER — Other Ambulatory Visit: Payer: Self-pay

## 2020-10-14 ENCOUNTER — Ambulatory Visit
Admission: EM | Admit: 2020-10-14 | Discharge: 2020-10-14 | Disposition: A | Discharge status: Home / Self Care | Payer: 59 | Attending: Emergency Medicine | Admitting: Emergency Medicine

## 2020-10-14 DIAGNOSIS — J029 Acute pharyngitis, unspecified: Secondary | ICD-10-CM

## 2020-10-14 LAB — POCT RAPID STREP A: Streptococcus, Group A Screen (Direct): NEGATIVE

## 2020-10-14 NOTE — Discharge Instructions (Addendum)
Gargle with warm salt water 2-3 times a day to soothe your throat, aid in pain relief, and aid in healing.  Take over-the-counter ibuprofen according to the package instructions as needed for pain.  You can also use Chloraseptic or Sucrets lozenges, 1 lozenge every 2 hours as needed for throat pain.  If you develop any new or worsening symptoms return for reevaluation.

## 2020-10-14 NOTE — ED Provider Notes (Signed)
[facility]    CSN: [id number] Arrival date & time: 10/14/20  1324      History   Chief Complaint Chief Complaint  Patient presents with   Sore Throat    HPI Angelica Banks is a 40 y.o. female.   HPI  40 year old female here for evaluation of sore throat and swollen glands.  Patient reports that she woke up with sore throat and swollen glands this morning.  She had recently been on vacation with her family and extended family and several of extended family has started to develop sore throats throughout the week.  Patient reports that her son also developed a sore throat yesterday.  She denies any fever, runny nose nasal congestion, ear pain or pressure, trouble breathing, or cough.  She states that she feels like the back of her throat is swollen.  Past Medical History:  Diagnosis Date   ADD (attention deficit disorder)    Anemia    iron deficiency anemia-LAST HGB 11.8 ON 03-2016   Arthritis    Rheumatoid arthritis   Asthma    well-controlled-no inhalers   GERD (gastroesophageal reflux disease)    occ- rolaids   Headache    History of Papanicolaou smear of cervix 04/16/2012   -/-   Hyperlipemia    Hypertension    Pre-eclampsia    Preeclampsia    Vitamin D deficiency     Patient Active Problem List   Diagnosis Date Noted   Bacterial vaginosis 07/04/2020   Chronic pelvic pain in female 03/12/2017   Endometriosis determined by laparoscopy 03/06/2017   Endometrioma 01/27/2017   Hydrosalpinx 01/27/2017   Dysmenorrhea 12/24/2016   ADD (attention deficit disorder) 07/18/2016   Uncontrolled hypertension 07/18/2016   Hyperlipidemia 07/17/2016   Vitamin D deficiency 07/17/2016   Menorrhagia 03/09/2015   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   Migraines 10/28/2013   Rheumatoid arthritis with rheumatoid factor ([place]) 10/28/2013   Spina bifida ([place]) 10/28/2013    Past Surgical History:  Procedure Laterality Date   ABDOMINAL HYSTERECTOMY   07/04/2017   DILATATION & CURETTAGE/HYSTEROSCOPY WITH MYOSURE N/A 03/27/2016   Procedure: DILATATION & CURETTAGE/HYSTEROSCOPY WITH Jacklynn Barnacle, MYOMECTOMY;  Surgeon: Gae Dry, MD;  Location: [facility];  Service: Gynecology;  Laterality: N/A;   DILITATION & CURRETTAGE/HYSTROSCOPY WITH NOVASURE ABLATION N/A 03/09/2015   Procedure: DILATATION & CURETTAGE/HYSTEROSCOPY WITH NOVASURE ABLATION;  Surgeon: Gae Dry, MD;  Location: [facility];  Service: Gynecology;  Laterality: N/A;   LAPAROSCOPIC BILATERAL SALPINGECTOMY Right 03/06/2017   Procedure: LAPAROSCOPIC  SALPINGECTOMY WITH REMOVAL OF ANY ABNORMAL TISSUE NOTED;  Surgeon: Will Bonnet, MD;  Location: [facility];  Service: Gynecology;  Laterality: Right;   LAPAROSCOPIC OVARIAN CYSTECTOMY Right 03/06/2017   Procedure: LAPAROSCOPIC OVARIAN CYSTECTOMY;  Surgeon: Will Bonnet, MD;  Location: [facility];  Service: Gynecology;  Laterality: Right;   LAPAROSCOPY N/A 03/06/2017   Procedure: LAPAROSCOPY OPERATIVE;  Surgeon: Will Bonnet, MD;  Location: [facility];  Service: Gynecology;  Laterality: N/A;   SHOULDER ARTHROSCOPY WITH OPEN ROTATOR CUFF REPAIR Right 11/30/2019   Procedure: RIGHT SHOULDER ARTHROSCOPY WITH DEBRIDEMENT, DECOMPRESSION, ROTATOR CUFF REPAIR, AND POSSIBLE BICEPS TENODESIS.;  Surgeon: Corky Mull, MD;  Location: [facility];  Service: Orthopedics;  Laterality: Right;    OB History     Gravida  1   Para  1   Term  1   Preterm      AB      Living  1  SAB      IAB      Ectopic      Multiple      Live Births  1            Home Medications    Prior to Admission medications   Medication Sig Start Date End Date Taking? Authorizing Provider  amLODipine (NORVASC) 10 MG tablet Take 1 tablet (10 mg total) by mouth daily. 2/83/66  Yes Copland, Elmo Putt B, PA-C  fluticasone (FLONASE) 50 MCG/ACT nasal spray Place 2 sprays into both nostrils daily. Patient taking differently: Place 2 sprays into both  nostrils daily as needed for allergies. 04/09/18  Yes Lorin Picket, PA-C  hydrochlorothiazide (MICROZIDE) 12.5 MG capsule Take 2 capsules (25 mg total) by mouth daily. 2/94/76  Yes Copland, Deirdre Evener, PA-C  ibuprofen (ADVIL,MOTRIN) 600 MG tablet Take 1 tablet (600 mg total) by mouth every 6 (six) hours as needed for mild pain or cramping. 03/06/17  Yes Will Bonnet, MD  KEVZARA 200 MG/1.14ML SOSY Inject 200 mg into the skin every 14 (fourteen) days.  06/16/18  Yes [provider]  losartan (COZAAR) 100 MG tablet Take 1 tablet (100 mg total) by mouth daily. 5/46/50  Yes Copland, Deirdre Evener, PA-C  methotrexate 50 MG/2ML injection Inject 22.5 mg into the muscle once a week. 0.9 ml 04/10/17  Yes [provider]  traMADol (ULTRAM) 50 MG tablet Take 1-2 tablets (50-100 mg total) by mouth every 6 (six) hours as needed for moderate pain or severe pain. 11/30/19 11/29/20 Yes Poggi, Marshall Cork, MD    Family History Family History  Problem Relation Age of Onset   Hypertension Mother    Other Mother        Respiratory disorder   Hypertension Father    Hyperlipidemia Father    Lymphoma Father 5       non hodgkins    Brain cancer Paternal Uncle    Lung cancer Maternal Grandmother 68       not sure if tested, has communication   Breast cancer Paternal Aunt 79       not sure, has communication   Lung cancer Maternal Grandfather     Social History Social History   Tobacco Use   Smoking status: Never   Smokeless tobacco: Never  Vaping Use   Vaping Use: Never used  Substance Use Topics   Alcohol use: Yes    Comment: rarely   Drug use: No     Allergies   Cefaclor, Codeine, Penicillins, Prednisone, Sulfa antibiotics, and Sulfasalazine   Review of Systems Review of Systems  Constitutional:  Negative for activity change, appetite change and fever.  HENT:  Positive for sore throat. Negative for congestion, ear pain, rhinorrhea, trouble swallowing and voice change.    Respiratory:  Negative for cough.     Physical Exam Triage Vital Signs ED Triage Vitals  Enc Vitals Group     BP 10/14/20 1336 (!) 157/91     Pulse Rate 10/14/20 1336 83     Resp 10/14/20 1336 18     Temp 10/14/20 1336 98.9 F (37.2 C)     Temp Source 10/14/20 1336 Oral     SpO2 10/14/20 1336 98 %     Weight 10/14/20 1333 185 lb (83.9 kg)     Height 10/14/20 1333 5\' 4"  (1.626 m)     Head Circumference --      Peak Flow --      Pain Score 10/14/20  1333 7     Pain Loc --      Pain Edu? --      Excl. in [place]? --    No data found.  Updated Vital Signs BP (!) 157/91 (BP Location: Right Arm)   Pulse 83   Temp 98.9 F (37.2 C) (Oral)   Resp 18   Ht 5\' 4"  (1.626 m)   Wt 185 lb (83.9 kg)   LMP 06/18/2017 (Exact Date)   SpO2 98%   BMI 31.76 kg/m   Visual Acuity Right Eye Distance:   Left Eye Distance:   Bilateral Distance:    Right Eye Near:   Left Eye Near:    Bilateral Near:     Physical Exam Vitals and nursing note reviewed.  Constitutional:      General: She is not in acute distress.    Appearance: She is well-developed. She is not ill-appearing.  HENT:     Head: Normocephalic and atraumatic.     Right Ear: Tympanic membrane and ear canal normal. No middle ear effusion. Tympanic membrane is not erythematous.     Left Ear: Tympanic membrane and ear canal normal.  No middle ear effusion. Tympanic membrane is not erythematous.     Mouth/Throat:     Mouth: Mucous membranes are moist.     Pharynx: Oropharynx is clear. Uvula midline. No pharyngeal swelling, oropharyngeal exudate, posterior oropharyngeal erythema or uvula swelling.     Tonsils: 0 on the right. 0 on the left.  Cardiovascular:     Rate and Rhythm: Normal rate and regular rhythm.     Heart sounds: Normal heart sounds. No murmur heard.   No gallop.  Pulmonary:     Effort: Pulmonary effort is normal.     Breath sounds: Normal breath sounds. No wheezing, rhonchi or rales.  Musculoskeletal:      Cervical back: Normal range of motion and neck supple.  Lymphadenopathy:     Cervical: No cervical adenopathy.  Skin:    General: Skin is warm and dry.     Capillary Refill: Capillary refill takes less than 2 seconds.     Findings: No erythema.  Neurological:     General: No focal deficit present.     Mental Status: She is alert and oriented to person, place, and time.  Psychiatric:        Mood and Affect: Mood normal.        Behavior: Behavior normal.     UC Treatments / Results  Labs (all labs ordered are listed, but only abnormal results are displayed) Labs Reviewed  CULTURE, GROUP A STREP [facility])  POCT RAPID STREP A, ED / UC  POCT RAPID STREP A    EKG   Radiology No results found.  Procedures Procedures (including critical care time)  Medications Ordered in UC Medications - No data to display  Initial Impression / Assessment and Plan / UC Course  I have reviewed the triage vital signs and the nursing notes.  Pertinent labs & imaging results that were available during my care of the patient were reviewed by me and considered in my medical decision making (see chart for details).  Patient is a very pleasant and nontoxic-appearing 40 year old female here for evaluation of sore throat as outlined in HPI above.  Patient's physical exam reveals pearly gray tympanic membranes bilaterally with a normal light reflex and clear external auditory canals.  Oropharyngeal exam is benign.  Patient has no edema or erythema to her tonsillar pillars.  No exudate.  There is no posterior oropharyngeal erythema or injection.  No cervical lymphadenopathy appreciated exam.  Mallampati score of 2.  Rapid strep collected at triage was negative.  We will send throat swab for culture.  Patient advised to use conservative measures to include salt water gargles, ibuprofen, Sucrets and Chloraseptic lozenges, and to return for reevaluation for any new or worsening symptoms.   Final Clinical  Impressions(s) / UC Diagnoses   Final diagnoses:  Pharyngitis, unspecified etiology     Discharge Instructions      Gargle with warm salt water 2-3 times a day to soothe your throat, aid in pain relief, and aid in healing.  Take over-the-counter ibuprofen according to the package instructions as needed for pain.  You can also use Chloraseptic or Sucrets lozenges, 1 lozenge every 2 hours as needed for throat pain.  If you develop any new or worsening symptoms return for reevaluation.      ED Prescriptions   None    PDMP not reviewed this encounter.   Margarette Canada, NP 10/14/20 1409

## 2020-10-14 NOTE — ED Triage Notes (Signed)
Patient states that she has been having a sore throat and swollen glands since yesterday.

## 2020-10-18 LAB — CULTURE, GROUP A STREP (THRC)

## 2020-10-24 ENCOUNTER — Other Ambulatory Visit: Payer: Self-pay | Admitting: Obstetrics and Gynecology

## 2020-10-24 DIAGNOSIS — I10 Essential (primary) hypertension: Secondary | ICD-10-CM

## 2020-11-19 ENCOUNTER — Other Ambulatory Visit: Payer: Self-pay | Admitting: Obstetrics and Gynecology

## 2020-11-19 DIAGNOSIS — I10 Essential (primary) hypertension: Secondary | ICD-10-CM

## 2020-11-23 ENCOUNTER — Encounter: Payer: Self-pay | Admitting: Obstetrics and Gynecology

## 2020-11-26 ENCOUNTER — Other Ambulatory Visit: Payer: Self-pay | Admitting: Obstetrics and Gynecology

## 2020-11-26 DIAGNOSIS — I10 Essential (primary) hypertension: Secondary | ICD-10-CM

## 2020-12-06 ENCOUNTER — Other Ambulatory Visit: Payer: Self-pay | Admitting: Obstetrics and Gynecology

## 2020-12-06 DIAGNOSIS — I10 Essential (primary) hypertension: Secondary | ICD-10-CM

## 2020-12-20 ENCOUNTER — Ambulatory Visit: Payer: 59 | Admitting: Adult Health

## 2020-12-31 ENCOUNTER — Other Ambulatory Visit: Payer: Self-pay | Admitting: Obstetrics and Gynecology

## 2020-12-31 DIAGNOSIS — I10 Essential (primary) hypertension: Secondary | ICD-10-CM

## 2021-02-06 ENCOUNTER — Other Ambulatory Visit: Payer: Self-pay | Admitting: Obstetrics and Gynecology

## 2021-02-06 DIAGNOSIS — I10 Essential (primary) hypertension: Secondary | ICD-10-CM

## 2021-03-06 IMAGING — MR MR SHOULDER*R* W/O CM
5 series · 32 of 40 positions shown · non-contrast
Comparison: None.

CLINICAL DATA: Seropositive rheumatoid arthritis. Limited range of
motion.

EXAM:
MRI OF THE RIGHT SHOULDER WITHOUT CONTRAST
TECHNIQUE: Multiplanar, multisequence MR imaging of the shoulder was performed.
No intravenous contrast was administered.

[Series 5: PD fat-sat · axial · right · 4.0mm · 0.55mm/px · z∈[-83,+46]mm · 8 of 28 slices shown (1 of 2)]
[im 1/28]
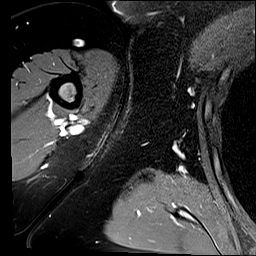
[im 4/28]
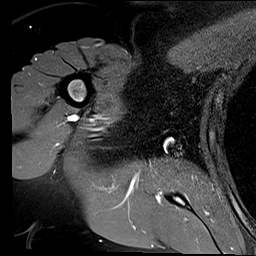
[im 10/28]
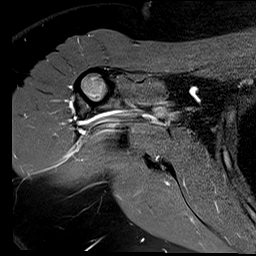
[im 13/28]
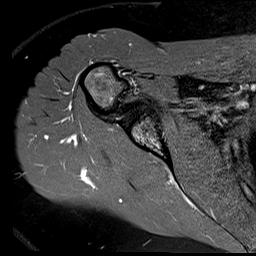
[im 16/28]
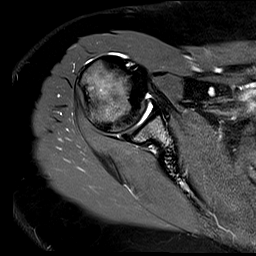
[im 19/28]
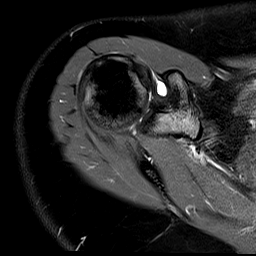
[im 25/28]
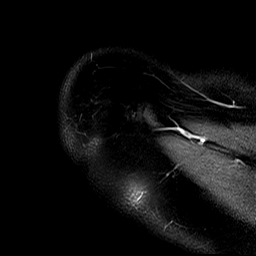
[im 28/28]
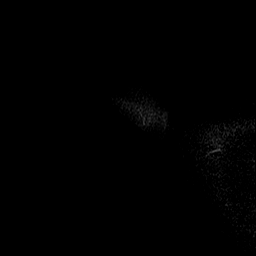

[Series 6: PD fat-sat · oblique · right · 4.0mm · 0.44mm/px · 8 of 26 slices shown (2 of 2)]
[im 1/26]
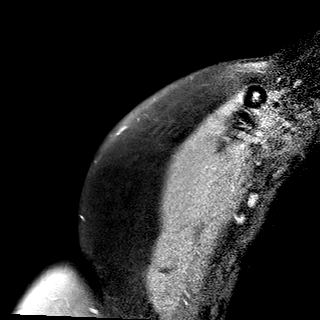
[im 4/26]
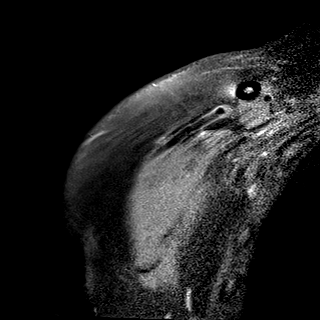
[im 8/26]
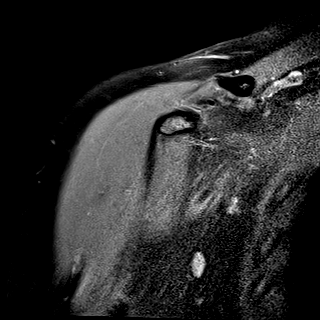
[im 11/26]
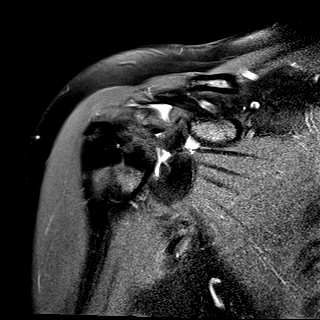
[im 15/26]
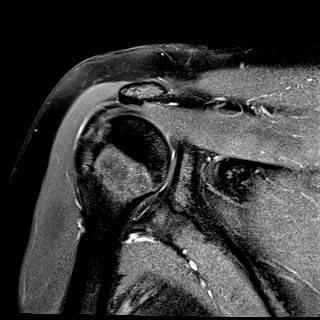
[im 18/26]
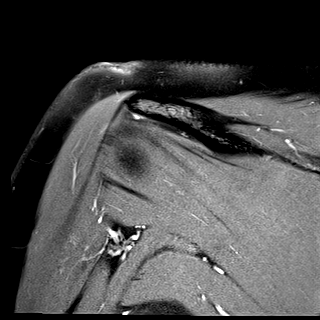
[im 22/26]
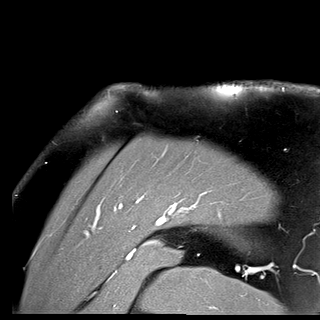
[im 26/26]
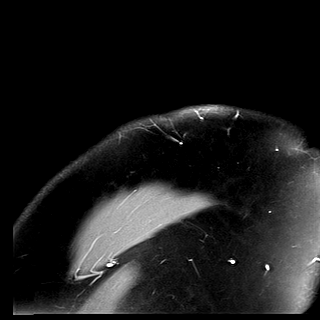

[Series 7: T2 fat-sat · oblique · right · 4.0mm · 0.44mm/px · 8 of 26 slices shown (1 of 2)]
[im 1/26]
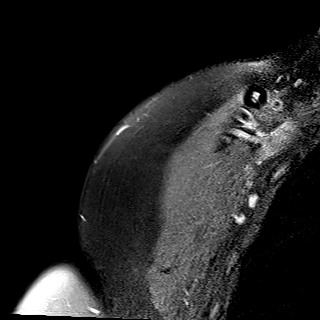
[im 4/26]
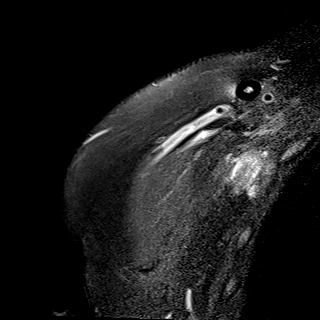
[im 8/26]
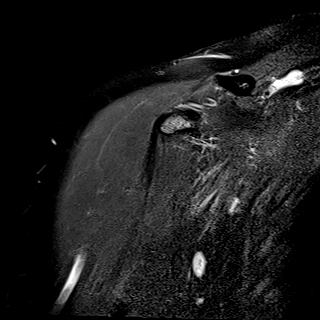
[im 11/26]
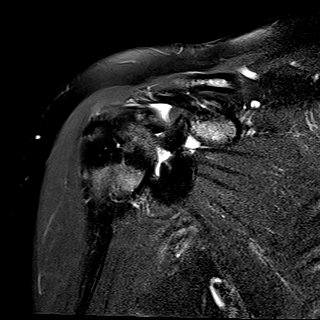
[im 15/26]
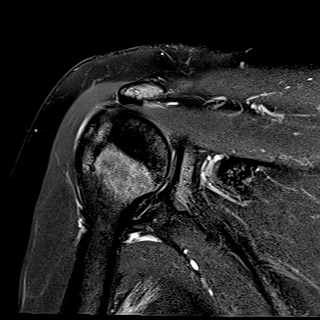
[im 18/26]
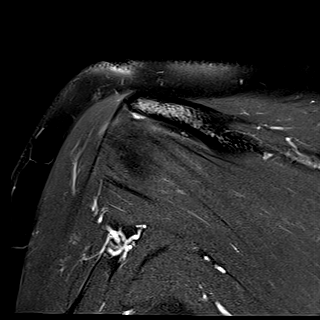
[im 22/26]
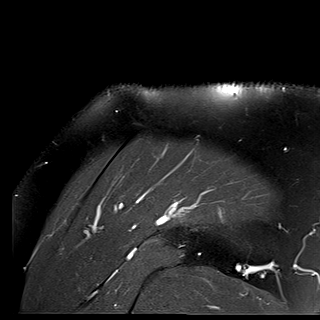
[im 26/26]
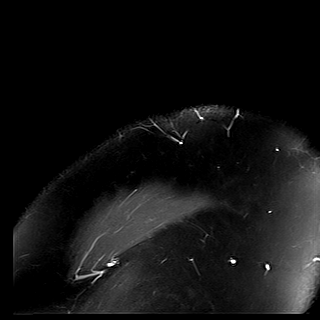

[Series 8: T2 fat-sat · oblique · right · 4.0mm · 0.23mm/px · 7 of 22 slices shown (2 of 2)]
[im 1/22]
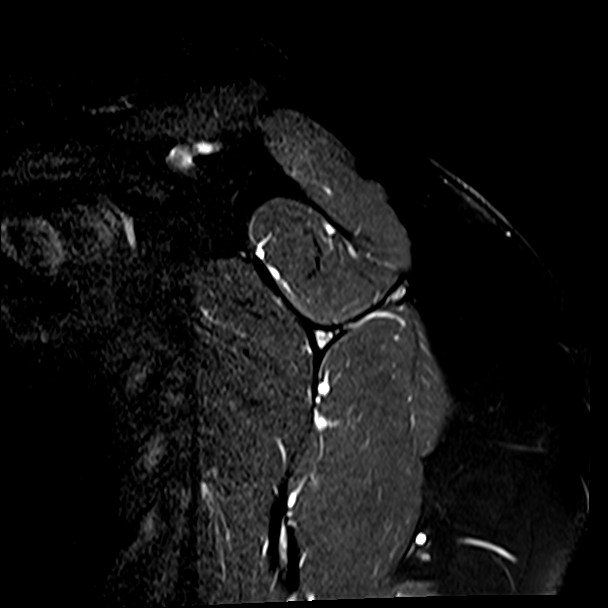
[im 4/22]
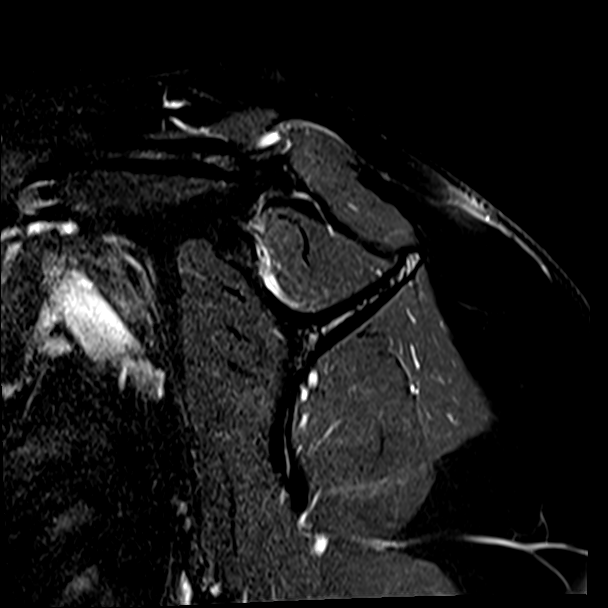
[im 8/22]
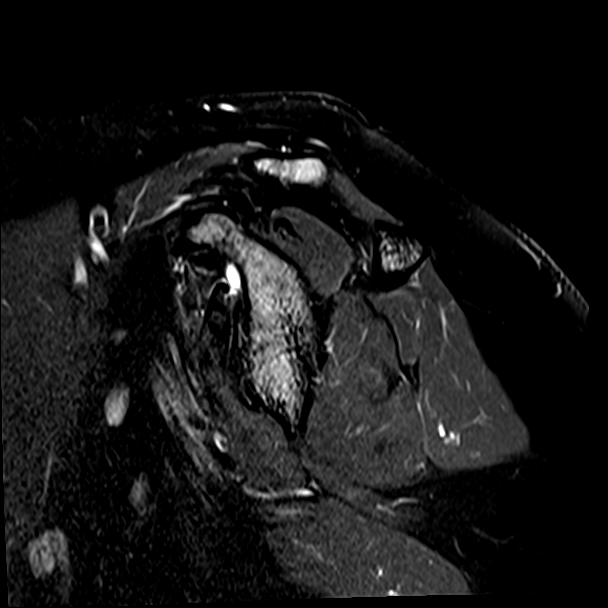
[im 11/22]
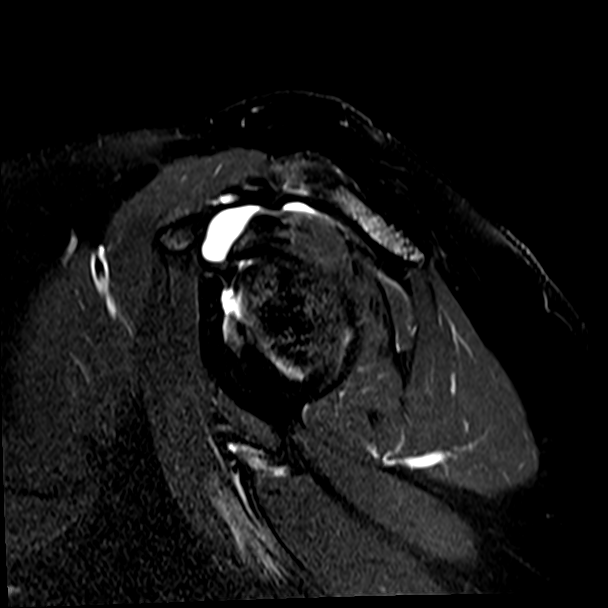
[im 15/22]
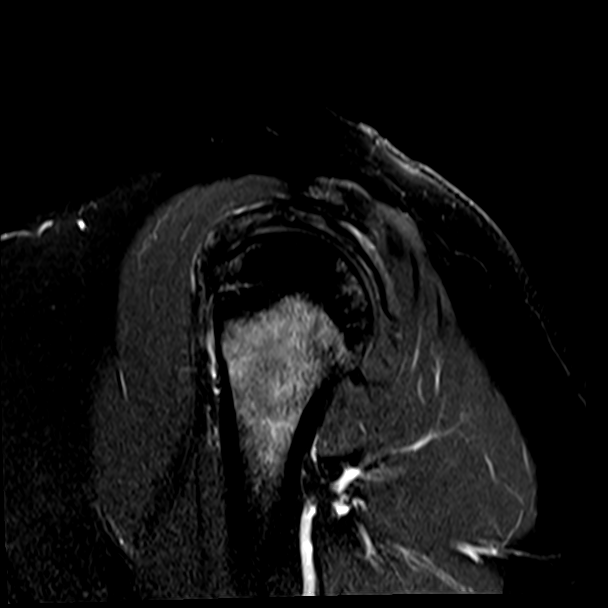
[im 18/22]
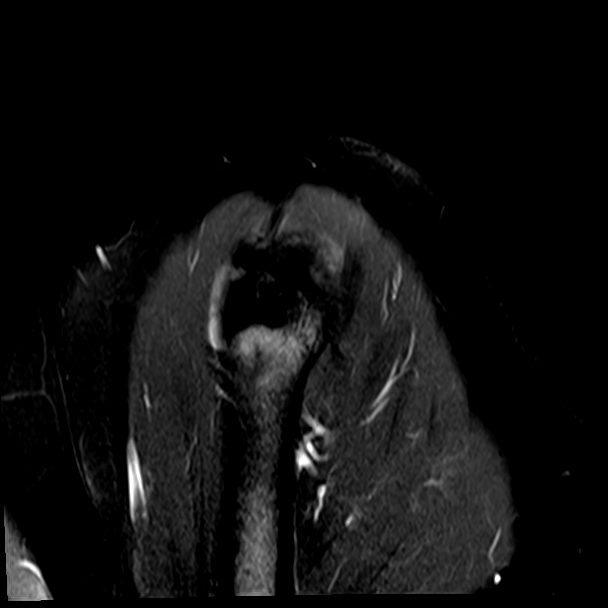
[im 22/22]
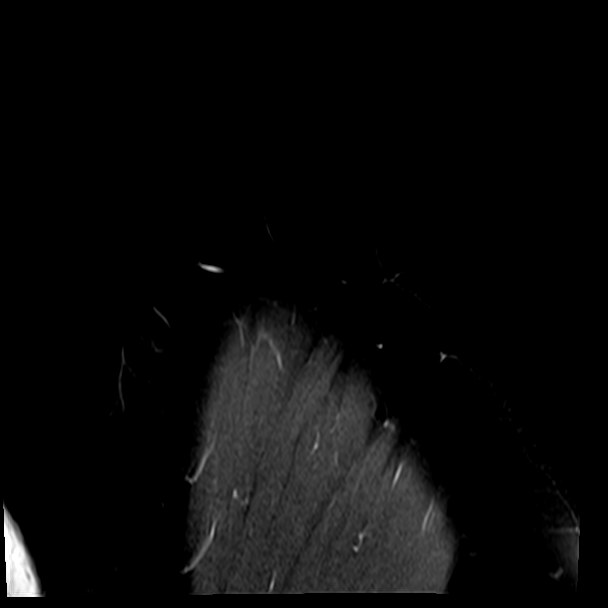

[Series 9: T1 · oblique · right · 4.0mm · 0.36mm/px · 1 of 22 slices shown]
[im 1/22]
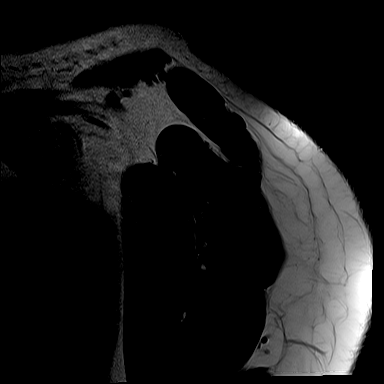

[32 of 40 positions shown; findings below may reference images not displayed]

FINDINGS: Rotator cuff: Severe tendinosis of the supraspinatus tendon with
fraying along the bursal surface. Mild tendinosis of the
infraspinatus tendon. Teres minor tendon is intact. Subscapularis
tendon is intact.

Muscles: No atrophy or fatty replacement of nor abnormal signal
within, the muscles of the rotator cuff.

Biceps long head:  Intact.

Acromioclavicular Joint: Normal acromioclavicular joint. Type I
acromion. Trace subacromial/subdeltoid bursal fluid.

Glenohumeral Joint: No joint effusion. No chondral defect.

Labrum: Grossly intact, but evaluation is limited by lack of
intraarticular fluid.

Bones:  No acute osseous abnormality.  No aggressive osseous lesion.

Other: No fluid collection or hematoma.
IMPRESSION: 1. Severe tendinosis of the supraspinatus tendon with fraying along
the bursal surface.
2. Mild tendinosis of the infraspinatus tendon.

## 2022-07-29 ENCOUNTER — Encounter: Payer: Self-pay | Admitting: Obstetrics and Gynecology

## 2022-07-31 ENCOUNTER — Other Ambulatory Visit (HOSPITAL_COMMUNITY)
Admission: RE | Admit: 2022-07-31 | Discharge: 2022-07-31 | Disposition: A | Discharge status: Home / Self Care | Payer: 59 | Source: Ambulatory Visit | Attending: Obstetrics and Gynecology | Admitting: Obstetrics and Gynecology

## 2022-07-31 ENCOUNTER — Ambulatory Visit (INDEPENDENT_AMBULATORY_CARE_PROVIDER_SITE_OTHER): Payer: 59

## 2022-07-31 VITALS — BP 130/80 | Ht 64.0 in | Wt 194.0 lb

## 2022-07-31 DIAGNOSIS — N898 Other specified noninflammatory disorders of vagina: Secondary | ICD-10-CM | POA: Diagnosis present

## 2022-07-31 DIAGNOSIS — B9689 Other specified bacterial agents as the cause of diseases classified elsewhere: Secondary | ICD-10-CM

## 2022-07-31 DIAGNOSIS — N760 Acute vaginitis: Secondary | ICD-10-CM | POA: Diagnosis present

## 2022-07-31 MED ORDER — METRONIDAZOLE 500 MG PO TABS
500.0000 mg | ORAL_TABLET | Freq: Two times a day (BID) | ORAL | 0 refills | Status: DC
Start: 2022-07-31 — End: 2024-01-04

## 2022-07-31 NOTE — Patient Instructions (Signed)

## 2022-07-31 NOTE — Progress Notes (Signed)
    NURSE VISIT NOTE  Subjective:    Patient ID: Angelica Banks, female    DOB: 28-Feb-1981, 42 y.o.   MRN: [id number]  HPI  Patient is a 42 y.o. G81P1001 female who presents for white vaginal discharge for 2 week(s). Denies abnormal vaginal bleeding or significant pelvic pain or fever. denies dysuria, hematuria, urinary frequency, urinary urgency, flank pain, abdominal pain, pelvic pain, cloudy malordorous urine, genital rash, and genital irritation. Patient denies history of known exposure to STD.   Objective:    BP 130/80   Ht  (1.626 m)   Wt 194 lb (88 kg)   LMP 06/18/2017 (Exact Date)   BMI 33.30 kg/m     VISIT ONLY@  Assessment:   1. BV (bacterial vaginosis)   2. Vaginal odor   3. Vaginal discharge       Plan:   BV and Yeast  DNA  probe sent to lab. Treatment: Flagyl 500 BID x 7 days and abstain from coitus during course of treatment ROV prn if symptoms persist or worsen.   Cornelius Moras, CMA

## 2022-08-01 LAB — CERVICOVAGINAL ANCILLARY ONLY
Bacterial Vaginitis (gardnerella): POSITIVE — AB
Candida Glabrata: NEGATIVE
Candida Vaginitis: NEGATIVE
Comment: NEGATIVE
Comment: NEGATIVE
Comment: NEGATIVE

## 2024-01-04 ENCOUNTER — Ambulatory Visit
Admission: EM | Admit: 2024-01-04 | Discharge: 2024-01-04 | Disposition: A | Discharge status: Home / Self Care | Attending: Emergency Medicine | Admitting: Emergency Medicine

## 2024-01-04 ENCOUNTER — Encounter: Payer: Self-pay | Admitting: Emergency Medicine

## 2024-01-04 DIAGNOSIS — B009 Herpesviral infection, unspecified: Secondary | ICD-10-CM | POA: Diagnosis present

## 2024-01-04 MED ORDER — IBUPROFEN 600 MG PO TABS: 600.0000 mg | ORAL_TABLET | Freq: Four times a day (QID) | ORAL | 0 refills | Status: AC | PRN

## 2024-01-04 MED ORDER — VALACYCLOVIR HCL 1 G PO TABS
1000.0000 mg | ORAL_TABLET | Freq: Two times a day (BID) | ORAL | 0 refills | Status: AC
Start: 2024-01-04 — End: 2024-01-14

## 2024-01-04 MED ORDER — VALACYCLOVIR HCL 1 G PO TABS: 2000.0000 mg | ORAL_TABLET | Freq: Two times a day (BID) | ORAL | 0 refills | Status: AC

## 2024-01-04 MED ORDER — MUPIROCIN 2 % EX OINT: 1.0000 | TOPICAL_OINTMENT | Freq: Two times a day (BID) | CUTANEOUS | 0 refills | Status: AC

## 2024-01-04 NOTE — ED Provider Notes (Signed)
 HPI  SUBJECTIVE:  Angelica Banks is a 43 y.o. female who presents with  painful swelling erythema and pruritus at the corner of her right mouth starting last night after trying meloxicam for the first time.  Symptoms started about 3 hours after taking it.  She reports blisters in the area of pain/erythema starting this morning.  She reports hives along her neck and chest yesterday-but these have resolved.  No angioedema, coughing, wheezing or shortness of breath, nausea, vomiting, diarrhea, abdominal pain, syncope.  She has never had symptoms like this before.  No known exposure to HSV 1 or 2.  She denies a blistery rash elsewhere.  She tried Benadryl with improvement in the facial swelling and itching.  No aggravating factors. She has a past medical history of rheumatoid arthritis, juvenile arthritis, hypertension, hyperlipidemia, asthma.  No history of HSV 1/2, chronic kidney disease.  LMP: Status post hysterectomy.  PCP: [facility] [place]  Past Medical History:  Diagnosis Date   ADD (attention deficit disorder)    Anemia    iron deficiency anemia-LAST HGB 11.8 ON 03-2016   Arthritis    Rheumatoid arthritis   Asthma    well-controlled-no inhalers   GERD (gastroesophageal reflux disease)    occ- rolaids   Headache    History of Papanicolaou smear of cervix 04/16/2012   -/-   Hyperlipemia    Hypertension    Pre-eclampsia    Preeclampsia    Vitamin D deficiency     Past Surgical History:  Procedure Laterality Date   ABDOMINAL HYSTERECTOMY  07/04/2017   DILATATION & CURETTAGE/HYSTEROSCOPY WITH MYOSURE N/A 03/27/2016   Procedure: DILATATION & CURETTAGE/HYSTEROSCOPY WITH MYOSURE, MYOMECTOMY;  Surgeon: Lamar SHAUNNA Lesches, MD;  Location: [facility];  Service: Gynecology;  Laterality: N/A;   DILITATION & CURRETTAGE/HYSTROSCOPY WITH NOVASURE ABLATION N/A 03/09/2015   Procedure: DILATATION & CURETTAGE/HYSTEROSCOPY WITH NOVASURE ABLATION;  Surgeon: Lamar SHAUNNA Lesches, MD;  Location: [facility];   Service: Gynecology;  Laterality: N/A;   LAPAROSCOPIC BILATERAL SALPINGECTOMY Right 03/06/2017   Procedure: LAPAROSCOPIC  SALPINGECTOMY WITH REMOVAL OF ANY ABNORMAL TISSUE NOTED;  Surgeon: Leonce Garnette BIRCH, MD;  Location: [facility];  Service: Gynecology;  Laterality: Right;   LAPAROSCOPIC OVARIAN CYSTECTOMY Right 03/06/2017   Procedure: LAPAROSCOPIC OVARIAN CYSTECTOMY;  Surgeon: Leonce Garnette BIRCH, MD;  Location: [facility];  Service: Gynecology;  Laterality: Right;   LAPAROSCOPY N/A 03/06/2017   Procedure: LAPAROSCOPY OPERATIVE;  Surgeon: Leonce Garnette BIRCH, MD;  Location: [facility];  Service: Gynecology;  Laterality: N/A;   SHOULDER ARTHROSCOPY WITH OPEN ROTATOR CUFF REPAIR Right 11/30/2019   Procedure: RIGHT SHOULDER ARTHROSCOPY WITH DEBRIDEMENT, DECOMPRESSION, ROTATOR CUFF REPAIR, AND POSSIBLE BICEPS TENODESIS.;  Surgeon: Edie Norleen PARAS, MD;  Location: [facility];  Service: Orthopedics;  Laterality: Right;    Family History  Problem Relation Age of Onset   Hypertension Mother    Other Mother        Respiratory disorder   Hypertension Father    Hyperlipidemia Father    Lymphoma Father 69       non hodgkins    Brain cancer Paternal Uncle    Lung cancer Maternal Grandmother 5       not sure if tested, has communication   Breast cancer Paternal Aunt 82       not sure, has communication   Lung cancer Maternal Grandfather     Social History   Tobacco Use   Smoking status: Never   Smokeless tobacco: Never  Vaping Use  Vaping status: Never Used  Substance Use Topics   Alcohol use: Yes    Comment: rarely   Drug use: No    No current facility-administered medications for this encounter.  Current Outpatient Medications:    amLODipine  (NORVASC ) 10 MG tablet, Take 1 tablet (10 mg total) by mouth daily., Disp: 90 tablet, Rfl: 0   ibuprofen  (ADVIL ) 600 MG tablet, Take 1 tablet (600 mg total) by mouth every 6 (six) hours as needed., Disp: 30 tablet, Rfl: 0   KEVZARA 200 MG/1. SOSY,  Inject 200 mg into the skin every 14 (fourteen) days. , Disp: , Rfl:    losartan  (COZAAR ) 100 MG tablet, Take 1 tablet (100 mg total) by mouth daily., Disp: 90 tablet, Rfl: 0   mupirocin ointment (BACTROBAN) 2 %, Apply 1 Application topically 2 (two) times daily., Disp: 22 g, Rfl: 0   valACYclovir (VALTREX) 1000 MG tablet, Take 2 tablets (2,000 mg total) by mouth every 12 (twelve) hours. X 1 day, Disp: 12 tablet, Rfl: 0   valACYclovir (VALTREX) 1000 MG tablet, Take 1 tablet (1,000 mg total) by mouth 2 (two) times daily for 10 days., Disp: 20 tablet, Rfl: 0   atorvastatin (LIPITOR) 80 MG tablet, Take 80 mg by mouth daily., Disp: , Rfl:    Cholecalciferol 10 MCG (400 UNIT) CAPS, Take 10 mcg by mouth., Disp: , Rfl:    fluticasone  (FLONASE ) 50 MCG/ACT nasal spray, Place 2 sprays into both nostrils daily. (Patient taking differently: Place 2 sprays into both nostrils daily as needed for allergies.), Disp: 16 g, Rfl: 0   olmesartan (BENICAR) 40 MG tablet, Take 40 mg by mouth daily., Disp: , Rfl:    pantoprazole (PROTONIX) 40 MG tablet, Take 40 mg by mouth 2 (two) times daily., Disp: , Rfl:   Allergies  Allergen Reactions   Cefaclor Rash    rash   Codeine Nausea Only and Other (See Comments)    jittery   Penicillins Rash and Other (See Comments)    Has patient had a PCN reaction causing immediate rash, facial/tongue/throat swelling, SOB or lightheadedness with hypotension:unsure Has patient had a PCN reaction causing severe rash involving mucus membranes or skin necrosis:unsure Has patient had a PCN reaction that required hospitalization:No Has patient had a PCN reaction occurring within the last 10 years:No If all of the above answers are NO, then may proceed with Cephalosporin use. Childhood reaction    Prednisone Palpitations   Sulfa Antibiotics Rash   Sulfasalazine Rash     ROS  As noted in HPI.   Physical Exam  BP (!) 147/98 (BP Location: Left Arm)   Pulse 71   Temp 98.7 F  (37.1 C) (Oral)   Resp 14   Ht 5' 4 (1.626 m)   Wt 88 kg   LMP 06/18/2017 (Exact Date)   SpO2 96%   BMI 33.30 kg/m   Constitutional: Well developed, well nourished, no acute distress Eyes:  EOMI, conjunctiva normal bilaterally HENT: Normocephalic, atraumatic,mucus membranes moist.  No intraoral rash.  No angioedema of the lips or tongue.  Airway widely patent.  Normal voice Neck: No cervical lymphadenopathy Respiratory: Normal inspiratory effort, lungs clear bilaterally Cardiovascular: Normal rate GI: nondistended skin:  herpetic lesion at corner of right mouth with clear serosanguineous drainage  Musculoskeletal: no deformities Neurologic: Alert & oriented x 3, no focal neuro deficits Psychiatric: Speech and behavior appropriate   ED Course   Medications - No data to display  Orders Placed This Encounter  Procedures  HSV 1/2 PCR (Surface)    Standing Status:   Standing    Number of Occurrences:   1    No results found for this or any previous visit (from the past 24 hours). No results found.  ED Clinical Impression  1. Herpetic lesion      ED Assessment/Plan     Patient consented to the use of photography for clinical documentation  Presentation consistent with HSV 1.  Will check HSV 1/2 culture.  Could be a drug reaction to the meloxicam although she has tolerated other NSAIDs in the past without any problem.  There is no evidence of SJS.  Will send home with Valtrex for initial outbreak and a second prescription of Valtrex in case this is HSV 1/2 and it comes back.  Will also send home with Bactroban, ibuprofen  which she has tolerated before without any problem.  Follow-up with PCP as needed.  Discussed  MDM, treatment plan, and plan for follow-up with patient.  Written ER return precautions given.  patient agrees with plan.   Meds ordered this encounter  Medications   valACYclovir (VALTREX) 1000 MG tablet    Sig: Take 2 tablets (2,000 mg total) by mouth  every 12 (twelve) hours. X 1 day    Dispense:  12 tablet    Refill:  0   valACYclovir (VALTREX) 1000 MG tablet    Sig: Take 1 tablet (1,000 mg total) by mouth 2 (two) times daily for 10 days.    Dispense:  20 tablet    Refill:  0   mupirocin ointment (BACTROBAN) 2 %    Sig: Apply 1 Application topically 2 (two) times daily.    Dispense:  22 g    Refill:  0   ibuprofen  (ADVIL ) 600 MG tablet    Sig: Take 1 tablet (600 mg total) by mouth every 6 (six) hours as needed.    Dispense:  30 tablet    Refill:  0      *This clinic note was created using Scientist, clinical (histocompatibility and immunogenetics). Therefore, there may be occasional mistakes despite careful proofreading.  ?    Van Knee, MD 01/04/24 1440

## 2024-01-04 NOTE — ED Triage Notes (Addendum)
 Patient states that she started having facial swelling and tingling in her face that started yesterday.  Patient took Benadryl yesterday.  Patient woke up this morning with a blister on the right side of her lip and is having some drainage.  Patient denies fevers.

## 2024-01-04 NOTE — Discharge Instructions (Addendum)
 I have sent off a culture to test for HSV 1 and 2.  Take the Valtrex 1000 mg twice a day for 10 days.  This is for an initial episode.  If your herpes culture comes back positive and your symptoms recur, then take the Valtrex 2 tablets every 12 hours for 1 day.  Stop the meloxicam for now.  May take ibuprofen  combined with 1000 mg Tylenol  3-4 times a day as needed for pain.  Bactroban will help prevent secondary infection.  Go to the ER for tongue or lip swelling, difficulty breathing, wheezing, shortness of breath, if you pass out, if the hives come back and are everywhere, or further concerns

## 2024-01-05 LAB — HSV 1/2 PCR (SURFACE)
HSV-1 DNA: NOT DETECTED
HSV-2 DNA: NOT DETECTED
